# Patient Record
Sex: Female | Born: 1970 | Race: White | Hispanic: No | Marital: Married | Smoking: Never smoker
Health system: Southern US, Community
[De-identification: ages and names within clinical notes are randomized; demographics above are authoritative.]

## PROBLEM LIST (undated history)

## (undated) DIAGNOSIS — N920 Excessive and frequent menstruation with regular cycle: Secondary | ICD-10-CM

## (undated) DIAGNOSIS — R51 Headache: Secondary | ICD-10-CM

## (undated) DIAGNOSIS — Z8719 Personal history of other diseases of the digestive system: Secondary | ICD-10-CM

## (undated) DIAGNOSIS — G43909 Migraine, unspecified, not intractable, without status migrainosus: Secondary | ICD-10-CM

## (undated) DIAGNOSIS — D649 Anemia, unspecified: Secondary | ICD-10-CM

## (undated) DIAGNOSIS — Z973 Presence of spectacles and contact lenses: Secondary | ICD-10-CM

## (undated) HISTORY — PX: COLONOSCOPY: SHX174

## (undated) HISTORY — PX: UPPER GI ENDOSCOPY: SHX6162

## (undated) HISTORY — PX: WISDOM TOOTH EXTRACTION: SHX21

## (undated) HISTORY — PX: WRIST SURGERY: SHX841

---

## 1998-04-18 ENCOUNTER — Other Ambulatory Visit: Admission: RE | Admit: 1998-04-18 | Discharge: 1998-04-18 | Payer: Self-pay | Admitting: Obstetrics and Gynecology

## 1999-04-24 ENCOUNTER — Other Ambulatory Visit: Admission: RE | Admit: 1999-04-24 | Discharge: 1999-04-24 | Payer: Self-pay | Admitting: *Deleted

## 1999-12-03 ENCOUNTER — Encounter: Admission: RE | Admit: 1999-12-03 | Discharge: 1999-12-03 | Payer: Self-pay | Admitting: Family Medicine

## 1999-12-03 ENCOUNTER — Encounter: Payer: Self-pay | Admitting: Family Medicine

## 2000-04-27 ENCOUNTER — Other Ambulatory Visit: Admission: RE | Admit: 2000-04-27 | Discharge: 2000-04-27 | Payer: Self-pay | Admitting: Obstetrics and Gynecology

## 2000-07-20 ENCOUNTER — Encounter (INDEPENDENT_AMBULATORY_CARE_PROVIDER_SITE_OTHER): Payer: Self-pay | Admitting: Specialist

## 2000-07-20 ENCOUNTER — Ambulatory Visit (HOSPITAL_COMMUNITY): Admission: RE | Admit: 2000-07-20 | Discharge: 2000-07-20 | Payer: Self-pay | Admitting: Gastroenterology

## 2001-01-19 ENCOUNTER — Encounter: Admission: RE | Admit: 2001-01-19 | Discharge: 2001-01-19 | Payer: Self-pay | Admitting: Family Medicine

## 2001-01-19 ENCOUNTER — Encounter: Payer: Self-pay | Admitting: Family Medicine

## 2001-05-06 ENCOUNTER — Other Ambulatory Visit: Admission: RE | Admit: 2001-05-06 | Discharge: 2001-05-06 | Payer: Self-pay | Admitting: Obstetrics and Gynecology

## 2001-06-21 ENCOUNTER — Ambulatory Visit (HOSPITAL_COMMUNITY): Admission: RE | Admit: 2001-06-21 | Discharge: 2001-06-21 | Payer: Self-pay | Admitting: Obstetrics and Gynecology

## 2001-06-21 ENCOUNTER — Encounter: Payer: Self-pay | Admitting: Obstetrics and Gynecology

## 2001-07-21 ENCOUNTER — Encounter: Admission: RE | Admit: 2001-07-21 | Discharge: 2001-10-19 | Payer: Self-pay | Admitting: Obstetrics and Gynecology

## 2001-12-30 ENCOUNTER — Inpatient Hospital Stay (HOSPITAL_COMMUNITY): Admission: AD | Admit: 2001-12-30 | Discharge: 2001-12-30 | Payer: Self-pay | Admitting: Obstetrics & Gynecology

## 2002-01-27 ENCOUNTER — Inpatient Hospital Stay (HOSPITAL_COMMUNITY): Admission: AD | Admit: 2002-01-27 | Discharge: 2002-01-29 | Payer: Self-pay | Admitting: Obstetrics & Gynecology

## 2002-03-07 ENCOUNTER — Other Ambulatory Visit: Admission: RE | Admit: 2002-03-07 | Discharge: 2002-03-07 | Payer: Self-pay | Admitting: Obstetrics and Gynecology

## 2003-03-10 ENCOUNTER — Other Ambulatory Visit: Admission: RE | Admit: 2003-03-10 | Discharge: 2003-03-10 | Payer: Self-pay | Admitting: Obstetrics and Gynecology

## 2004-07-20 ENCOUNTER — Emergency Department (HOSPITAL_COMMUNITY): Admission: EM | Admit: 2004-07-20 | Discharge: 2004-07-20 | Payer: Self-pay | Admitting: Emergency Medicine

## 2010-02-14 ENCOUNTER — Encounter: Admission: RE | Admit: 2010-02-14 | Discharge: 2010-02-14 | Payer: Self-pay | Admitting: Cardiology

## 2010-03-18 ENCOUNTER — Encounter: Admission: RE | Admit: 2010-03-18 | Discharge: 2010-03-18 | Payer: Self-pay | Admitting: Obstetrics and Gynecology

## 2010-04-28 HISTORY — PX: WRIST SURGERY: SHX841

## 2010-09-13 NOTE — Op Note (Signed)
[place]  Patient:    Robin Gordon, Robin Gordon                        MRN: [id number] Proc. Date: 07/20/00 Adm. Date:  [id number] Attending:  Nelda Marseille                           Operative Report  PROCEDURE:  Colonoscopy with polypectomy.  ENDOSCOPIST:  Petra Kuba, M.D.  INDICATIONS:  Patient with bright red blood per rectum.  History of colon polyps.  INFORMED CONSENT:  Consent was signed after risk, benefits, methods and options were thoroughly discussed in the office.  MEDICINES USED:  Demerol 100 mg, Versed 10 mg.  PROCEDURE:  Rectal inspection is pertinent for a small external hemorrhoids with a small tear.  Digital exam was negative.  The pediatric video colonoscope was inserted.  She had a tortuous sigmoid which required rolling her on her back and once we rolled her on her back we were easily able to advance to the cecum.  This require some abdominal pressure.  On insertion no obvious abnormalities were seen.  The cecum was identified by the appendiceal orifice and the ileocecal valve.  In fact, the scope was inserted a short ways into the terminal ileum which was normal.  The scope was slowly withdrawn. The prep was adequate.  There was some liquid stool that required washing and suctioning.  Cecum and ascending and majority of the transverse was normal. In the more distal transverse a small 2-3 mm polyp was seen and was hot biopsied x 2.  Scope was further withdrawn.  No other abnormalities were seen as we slowly withdrew back to the rectum.  Once back to the rectum the scope was retroflexed pertinent for some tiny internal hemorrhoids.  Anorectal pull through confirmed the above.  The scope was reinserted.  Air was suctioned. Scope removed.  The patient tolerated the procedure well.  There was no obvious immediate complication.  ENDOSCOPIC DIAGNOSES: 1. Internal external hemorrhoids with a small external tear. 2. A 3 mm distal  transverse polyp hot biopsied x 2. 3. Otherwise within normal limits to the terminal ileum.  PLAN:  Await pathology to determine the future colonic screening.  Go ahead and treat hemorrhoids and the tears both with suppositories and creams.  I would be happy to see back p.r.n. otherwise in 2 months to recheck symptoms, guaiacs and make sure no further work-up plans are needed. DD:  07/20/00 TD:  07/20/00 Job: [id number]

## 2010-09-13 NOTE — Consult Note (Signed)
   NAME:  Robin Gordon, Robin Gordon                           ACCOUNT NO.:  [id number]   MEDICAL RECORD NO.:  [id number]                   PATIENT TYPE:  MAT   LOCATION:  [facility]                                 FACILITY:  [facility]   PHYSICIAN:  Lenoard Aden, M.D.             DATE OF BIRTH:  02-22-1971   DATE OF CONSULTATION:  12/30/2001  DATE OF DISCHARGE:                                   CONSULTATION   CHIEF COMPLAINT:  Rule out preterm labor.   HISTORY OF PRESENT ILLNESS:  The patient is a 40 year old white female, G4,  P1 expected day of delivery 02/14/02 at 33+ weeks with preterm contractions  without evidence of cervical change.   ALLERGIES:  The patient has no known drug allergies.   MEDICATIONS:  Prenatal vitamins.   OBSTETRIC HISTORY:  An 8 pound 9 ounce female born post dates in 25.  Spontaneous abortion in 12/01. Spontaneous abortion 12/02.  History of  irritable bowel syndrome.  History of fibromyalgia. History of colonic polyp  removal.  History of migraine headaches.  History of superficial  varicosities.  History of hyperglycemia.   FAMILY HISTORY:  History of cardiovascular disease and lupus.   PRENATAL LABORATORY DATA:  A blood type of O negative.  Rh antibody  negative.  Rubella immune. Hepatitis B surface antigen negative. HIV  declined.   PRENATAL COURSE:  Prenatal course compromised by preterm contractions.  No  evidence of cervical change.   PHYSICAL EXAMINATION:  GENERAL:  She is a well-developed, well-nourished  white female in no apparent distress. Temperature 98.3.  Pulse 102.  HEENT:  Normal.  LUNGS:  Clear.  HEART:  Regular rate and rhythm.  ABDOMEN:  Soft, gravid, nontender.  CERVICAL EXAM:  Per Dr. Cherly Hensen, closed, 3 cm long and presenting part is at  -3.  EXTREMITIES:  There are no cords.  NEUROLOGICAL:  Nonfocal.   NST:  Reactive. Irregular contractions now abated after subcutaneous  terbutaline x1.   IMPRESSION:  1. A 33-week obstetrics.  2.  Preterm contractions.  No evidence of cervical change.  3. Reassuring fetal heart rates, surveillance.  4. History of term vaginal delivery.    PLAN:  Discharge home. Procardia 10 mg p.o. q.4-6 h. p.r.n.  If recurrence  noted, will proceed with betamethasone treatment.  Follow up in the office  in one week.  Work precautions discussed.  Preterm labor warnings given.  Check clean catch urine UA today.                                                Lenoard Aden, M.D.    RJT/MEDQ  D:  12/30/2001  T:  12/30/2001  Job:  [id number]   cc:   [facility]

## 2010-09-13 NOTE — H&P (Signed)
NAME:  Robin Gordon, Robin Gordon                           ACCOUNT NO.:  [id number]   MEDICAL RECORD NO.:  [id number]                   PATIENT TYPE:  INP   LOCATION:  9143                                 [facility]ACILITY:  [facility]   PHYSICIAN:  Sheronette A. Cherly Hensen, M.D.         DATE OF BIRTH:  [birth year]   DATE OF ADMISSION:  01/27/2002  DATE OF DISCHARGE:                                HISTORY & PHYSICAL   CHIEF COMPLAINT:  Contractions, early labor.   HISTORY OF PRESENT ILLNESS:  This is a 40 year old gravida 4, para 1-0-2-1  married white female, at 37-3/[redacted] weeks gestation by early ultrasound and her  last menstrual period.  She is now being admitted in early labor, with an  examination that was 3, 60%, -2.  The patient has intact membranes.   Her prenatal course has been complicated by preterm labor, for which she was  placed on modified bed rest and given toco lytics.  Group B strep culture is  negative.  Prenatal care is at [facility].   PRIMARY OBSTETRICIAN:  Sheronette A. Cousins, M.D.   LABORATORY DATA:  Blood type 0 negative.  Her husband is A negative.  Antibody screen negative. RPR nonreactive.  Rubella immune.  Hepatitis B  surface antigen negative.  HIV test declined, as was amniocentesis and AFP3  test.  GC and Chlamydia cultures negative.  Pap was normal.  One-hour GCT  was normal.  Group B strep culture is negative.  Anatomic fetal survey done  no September 29, 2001 at 21 weeks was normal.   ALLERGIES:  No known drug allergies.   MEDICATIONS:  Prenatal vitamins.   PAST MEDICAL HISTORY:  1. Hypoglycemia.  2. Irritable bowel syndrome.  3. Fibromyalgia.  4. Migraine headaches.   PAST SURGICAL HISTORY:  Negative.   OBSTETRICAL HISTORY:  An 8 pound 9 ounce female in November 1997.  In December  2001 and 2002, spontaneous abortions.   GYN HISTORY:  The patient had been trying for two years, conceived on  Clomid.   FAMILY HISTORY:  None contributory.   SOCIAL HISTORY:  Married.   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   REVIEW OF SYSTEMS:  Negative, except as noted in the history of present  illness.   PHYSICAL EXAMINATION:  GENERAL:  Gravid white female, slightly  uncomfortable.  VITAL SIGNS:  Temperature 98.1, blood pressure 120/80.  SKIN:  Shows no lesions.  HEENT:  Anicteric sclerae.  Pink conjunctivae.  Oropharynx negative.  HEART:  Regular rate and rhythm with no murmur.  LUNGS:  Clear to auscultation.  BREASTS:  Soft, nontender.  No palpable masses.  ABDOMEN:  Gravid. Fundal height 37 cm.  PELVIC:  As per HPI.   DISPOSITION:  Artificial rupture of membranes was performed.  Copious clear  amniotic fluid was noted.  Cervix advanced to 4 cm, 60%, minus 2 station.  Tracing was reactive with uterine irritability and irregular contractions.  IMPRESSION:  Early labor.  Gestation at 37+ weeks.  Group B strep culture  was negative.   PLAN:  Admission, ambulation.  Pitocin p.r.n.  Epidural p.r.n.                                               Sheronette A. Cherly Hensen, M.D.    SAC/MEDQ  D:  01/27/2002  T:  01/27/2002  Job:  [id number]

## 2010-10-16 ENCOUNTER — Other Ambulatory Visit: Payer: Self-pay | Admitting: Obstetrics and Gynecology

## 2011-03-31 ENCOUNTER — Other Ambulatory Visit: Payer: Self-pay | Admitting: Obstetrics and Gynecology

## 2011-03-31 DIAGNOSIS — Z1231 Encounter for screening mammogram for malignant neoplasm of breast: Secondary | ICD-10-CM

## 2011-04-16 ENCOUNTER — Ambulatory Visit
Admission: RE | Admit: 2011-04-16 | Discharge: 2011-04-16 | Disposition: A | Discharge status: Home / Self Care | Payer: BC Managed Care – PPO | Source: Ambulatory Visit | Attending: Obstetrics and Gynecology | Admitting: Obstetrics and Gynecology

## 2011-04-16 DIAGNOSIS — Z1231 Encounter for screening mammogram for malignant neoplasm of breast: Secondary | ICD-10-CM

## 2011-04-28 ENCOUNTER — Other Ambulatory Visit: Payer: Self-pay | Admitting: Obstetrics and Gynecology

## 2011-04-28 DIAGNOSIS — R928 Other abnormal and inconclusive findings on diagnostic imaging of breast: Secondary | ICD-10-CM

## 2011-05-08 ENCOUNTER — Ambulatory Visit
Admission: RE | Admit: 2011-05-08 | Discharge: 2011-05-08 | Disposition: A | Discharge status: Home / Self Care | Payer: BC Managed Care – PPO | Source: Ambulatory Visit | Attending: Obstetrics and Gynecology | Admitting: Obstetrics and Gynecology

## 2011-05-08 DIAGNOSIS — R928 Other abnormal and inconclusive findings on diagnostic imaging of breast: Secondary | ICD-10-CM

## 2012-03-19 ENCOUNTER — Other Ambulatory Visit: Payer: Self-pay | Admitting: Obstetrics and Gynecology

## 2012-03-19 DIAGNOSIS — Z1231 Encounter for screening mammogram for malignant neoplasm of breast: Secondary | ICD-10-CM

## 2012-05-05 ENCOUNTER — Ambulatory Visit
Admission: RE | Admit: 2012-05-05 | Discharge: 2012-05-05 | Disposition: A | Discharge status: Home / Self Care | Payer: BC Managed Care – PPO | Source: Ambulatory Visit | Attending: Obstetrics and Gynecology | Admitting: Obstetrics and Gynecology

## 2012-05-05 DIAGNOSIS — Z1231 Encounter for screening mammogram for malignant neoplasm of breast: Secondary | ICD-10-CM

## 2013-05-10 ENCOUNTER — Ambulatory Visit
Admission: RE | Admit: 2013-05-10 | Discharge: 2013-05-10 | Disposition: A | Discharge status: Home / Self Care | Payer: BC Managed Care – PPO | Source: Ambulatory Visit | Attending: Family Medicine | Admitting: Family Medicine

## 2013-05-10 ENCOUNTER — Other Ambulatory Visit: Payer: Self-pay | Admitting: Family Medicine

## 2013-05-10 DIAGNOSIS — R059 Cough, unspecified: Secondary | ICD-10-CM

## 2013-05-10 DIAGNOSIS — R05 Cough: Secondary | ICD-10-CM

## 2013-05-24 ENCOUNTER — Other Ambulatory Visit: Payer: Self-pay

## 2013-05-24 DIAGNOSIS — Z1231 Encounter for screening mammogram for malignant neoplasm of breast: Secondary | ICD-10-CM

## 2013-06-08 ENCOUNTER — Ambulatory Visit
Admission: RE | Admit: 2013-06-08 | Discharge: 2013-06-08 | Disposition: A | Discharge status: Home / Self Care | Payer: BC Managed Care – PPO | Source: Ambulatory Visit

## 2013-06-08 DIAGNOSIS — Z1231 Encounter for screening mammogram for malignant neoplasm of breast: Secondary | ICD-10-CM

## 2014-06-22 ENCOUNTER — Other Ambulatory Visit: Payer: Self-pay

## 2014-06-22 DIAGNOSIS — Z1231 Encounter for screening mammogram for malignant neoplasm of breast: Secondary | ICD-10-CM

## 2014-07-03 ENCOUNTER — Ambulatory Visit: Payer: BC Managed Care – PPO

## 2014-07-12 ENCOUNTER — Ambulatory Visit
Admission: RE | Admit: 2014-07-12 | Discharge: 2014-07-12 | Disposition: A | Discharge status: Home / Self Care | Payer: BC Managed Care – PPO | Source: Ambulatory Visit

## 2014-07-12 DIAGNOSIS — Z1231 Encounter for screening mammogram for malignant neoplasm of breast: Secondary | ICD-10-CM

## 2015-04-29 HISTORY — PX: UPPER GI ENDOSCOPY: SHX6162

## 2015-08-16 ENCOUNTER — Other Ambulatory Visit: Payer: Self-pay

## 2015-08-16 DIAGNOSIS — Z1231 Encounter for screening mammogram for malignant neoplasm of breast: Secondary | ICD-10-CM

## 2015-09-03 ENCOUNTER — Ambulatory Visit
Admission: RE | Admit: 2015-09-03 | Discharge: 2015-09-03 | Disposition: A | Discharge status: Home / Self Care | Payer: BC Managed Care – PPO | Source: Ambulatory Visit

## 2015-09-03 DIAGNOSIS — Z1231 Encounter for screening mammogram for malignant neoplasm of breast: Secondary | ICD-10-CM

## 2015-09-19 ENCOUNTER — Other Ambulatory Visit: Payer: Self-pay | Admitting: Family Medicine

## 2015-09-19 DIAGNOSIS — R1011 Right upper quadrant pain: Secondary | ICD-10-CM

## 2015-09-20 ENCOUNTER — Ambulatory Visit
Admission: RE | Admit: 2015-09-20 | Discharge: 2015-09-20 | Disposition: A | Discharge status: Home / Self Care | Payer: BC Managed Care – PPO | Source: Ambulatory Visit | Attending: Family Medicine | Admitting: Family Medicine

## 2015-09-20 DIAGNOSIS — R1011 Right upper quadrant pain: Secondary | ICD-10-CM

## 2015-09-26 ENCOUNTER — Other Ambulatory Visit: Payer: BC Managed Care – PPO

## 2015-11-19 ENCOUNTER — Other Ambulatory Visit: Payer: Self-pay | Admitting: Gastroenterology

## 2015-11-19 DIAGNOSIS — R1011 Right upper quadrant pain: Secondary | ICD-10-CM

## 2015-11-19 DIAGNOSIS — R634 Abnormal weight loss: Secondary | ICD-10-CM

## 2015-11-21 ENCOUNTER — Other Ambulatory Visit: Payer: BC Managed Care – PPO

## 2015-11-21 ENCOUNTER — Ambulatory Visit
Admission: RE | Admit: 2015-11-21 | Discharge: 2015-11-21 | Disposition: A | Discharge status: Home / Self Care | Payer: BC Managed Care – PPO | Source: Ambulatory Visit | Attending: Gastroenterology | Admitting: Gastroenterology

## 2015-11-21 DIAGNOSIS — R634 Abnormal weight loss: Secondary | ICD-10-CM

## 2015-11-21 DIAGNOSIS — R1011 Right upper quadrant pain: Secondary | ICD-10-CM

## 2015-11-21 MED ORDER — IOPAMIDOL (ISOVUE-300) INJECTION 61%
100.0000 mL | Freq: Once | INTRAVENOUS | Status: AC | PRN
  Administered 2015-11-21: 100 mL via INTRAVENOUS

## 2015-11-22 ENCOUNTER — Telehealth: Payer: Self-pay | Admitting: *Deleted

## 2015-11-22 NOTE — Telephone Encounter (Signed)
-----   Message from Irven Baltimore sent at 11/21/2015  4:28 PM EDT ----- Patient states that she was just talking to you and the phone call dropped. Best # [phone]

## 2015-11-22 NOTE — Telephone Encounter (Signed)
I called the patient back and admitted I didn't recall, calling her.  She said she was trying to reach the nurse for Dr. Watt Climes. I advised her he is not at this office.  She apologized for calling our number.

## 2015-11-23 ENCOUNTER — Other Ambulatory Visit: Payer: BC Managed Care – PPO

## 2015-12-05 ENCOUNTER — Other Ambulatory Visit: Payer: Self-pay | Admitting: Gastroenterology

## 2016-08-26 ENCOUNTER — Other Ambulatory Visit: Payer: Self-pay | Admitting: Family Medicine

## 2016-08-26 DIAGNOSIS — Z1231 Encounter for screening mammogram for malignant neoplasm of breast: Secondary | ICD-10-CM

## 2016-09-17 ENCOUNTER — Ambulatory Visit
Admission: RE | Admit: 2016-09-17 | Discharge: 2016-09-17 | Disposition: A | Discharge status: Home / Self Care | Payer: BC Managed Care – PPO | Source: Ambulatory Visit | Attending: Family Medicine | Admitting: Family Medicine

## 2016-09-17 DIAGNOSIS — Z1231 Encounter for screening mammogram for malignant neoplasm of breast: Secondary | ICD-10-CM

## 2017-02-18 IMAGING — US US ABDOMEN LIMITED
1 series · 14 of 25 positions shown · non-contrast
Comparison: No recent prior.

CLINICAL DATA: Right upper quadrant pain.

EXAM:
US ABDOMEN LIMITED - RIGHT UPPER QUADRANT

[Series 1: us abdomen limited · 0.32mm/px · 14 of 36 slices shown]
[im 1/36]
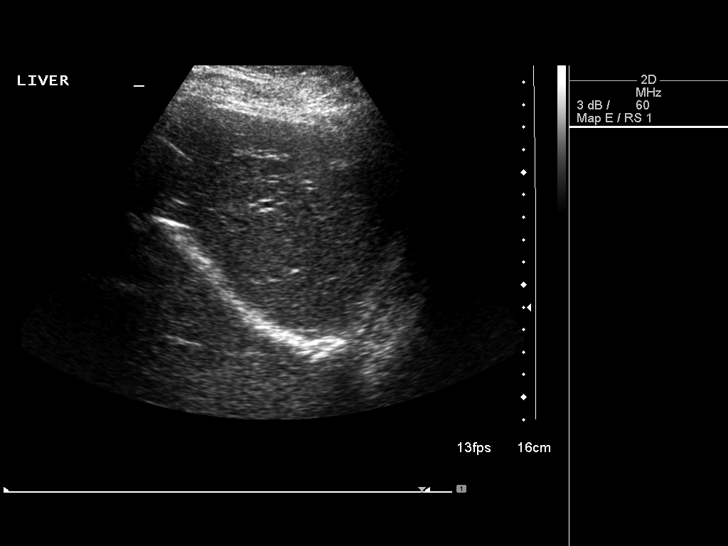
[im 3/36]
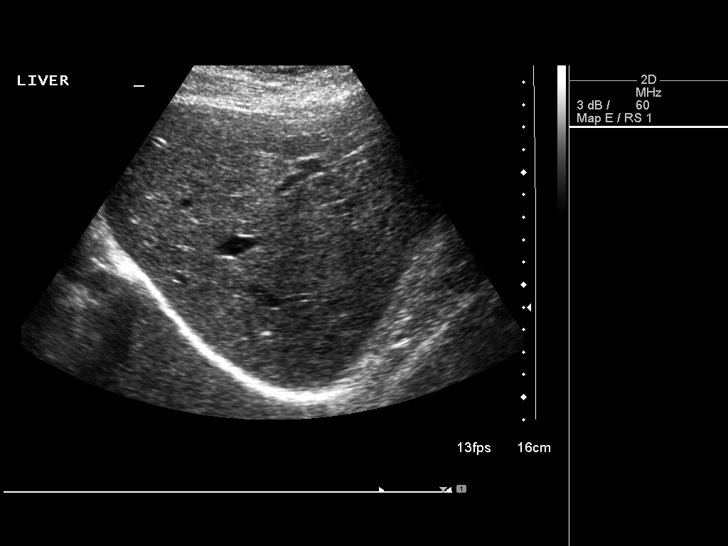
[im 6/36]
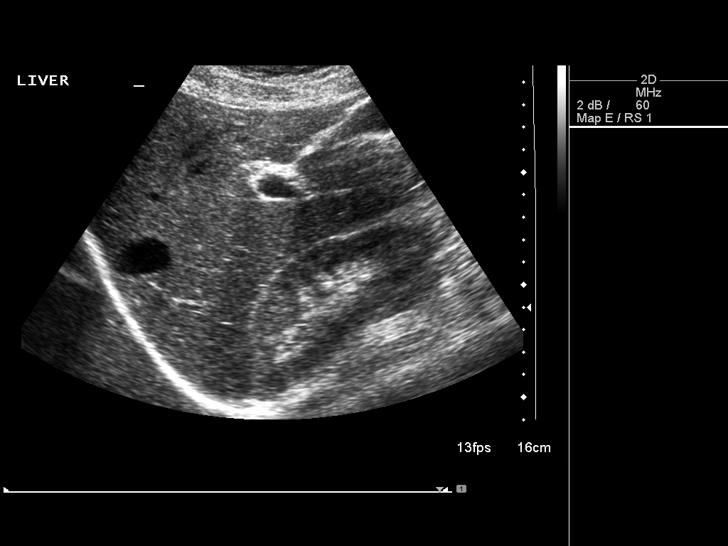
[im 9/36]
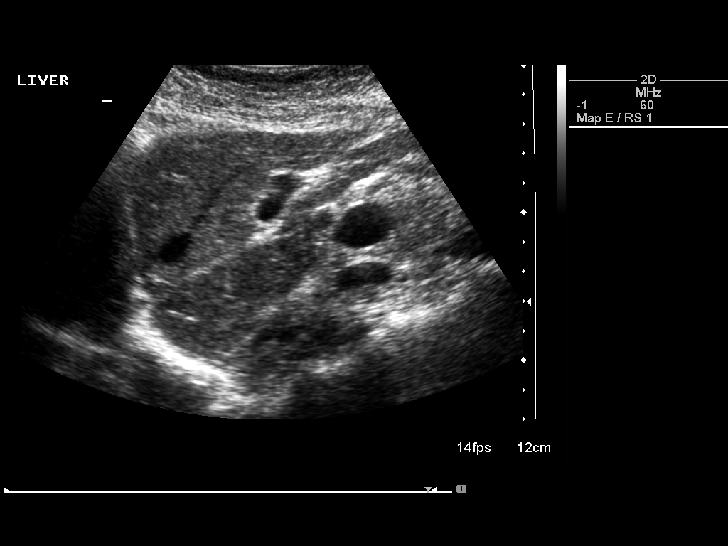
[im 12/36]
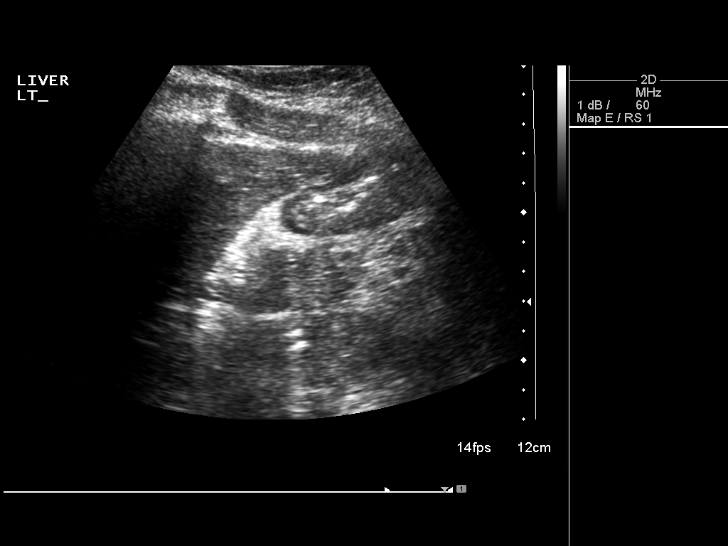
[im 14/36]
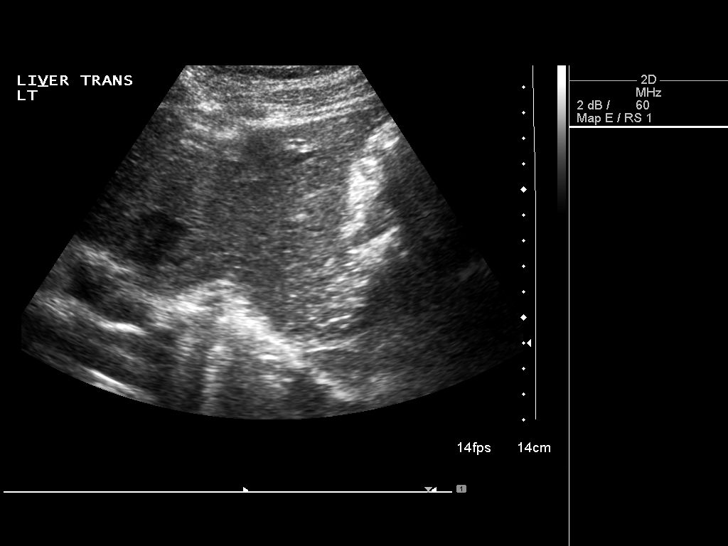
[im 17/36]
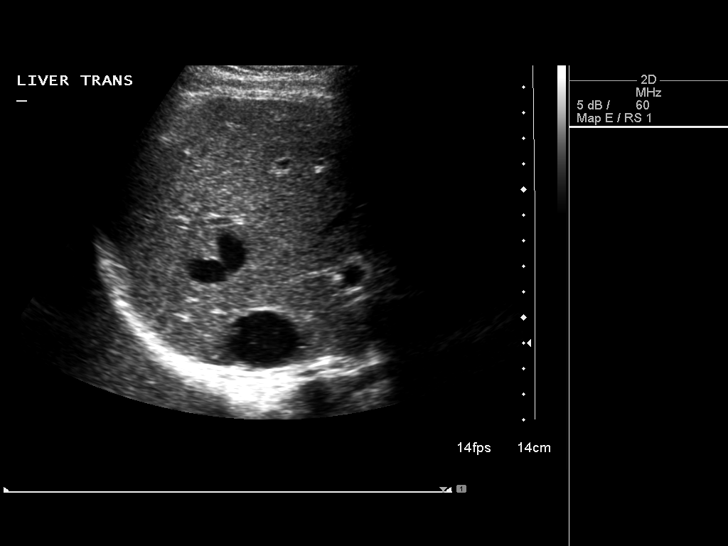
[im 19/36]
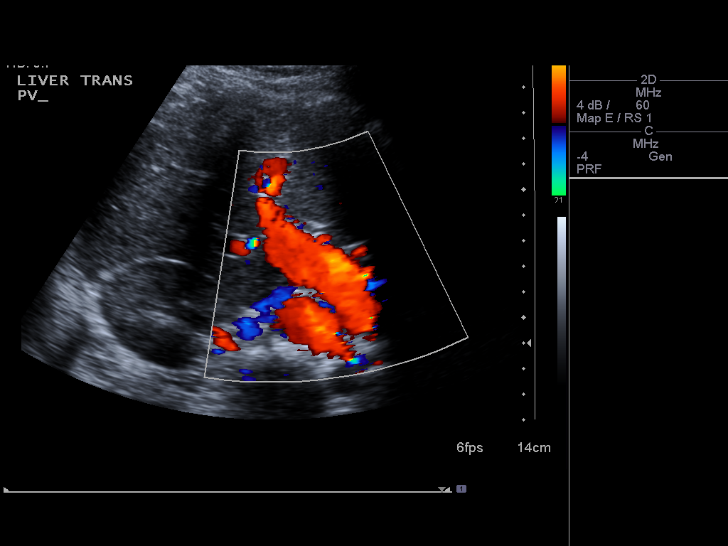
[im 22/36]
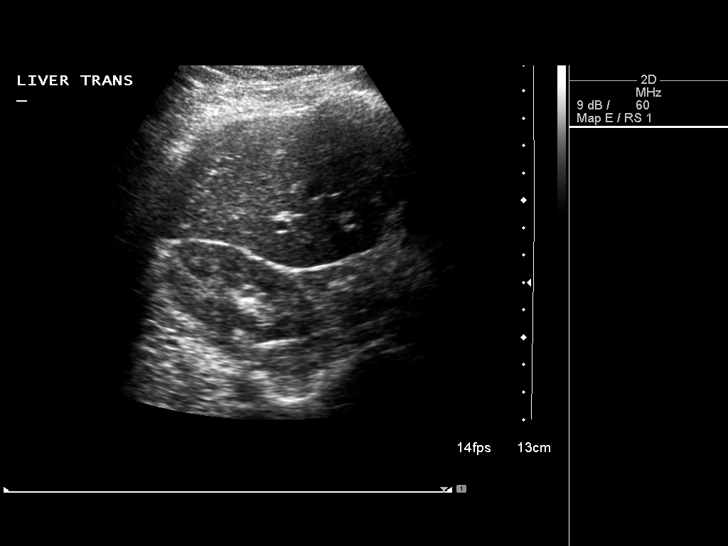
[im 24/36]
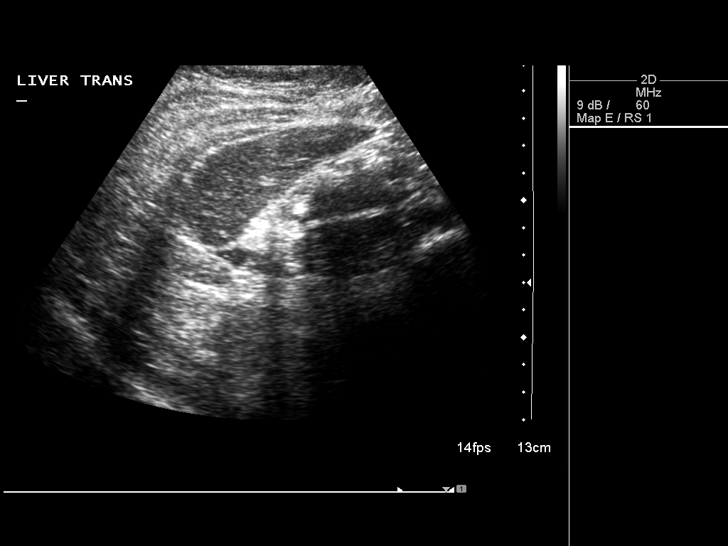
[im 27/36]
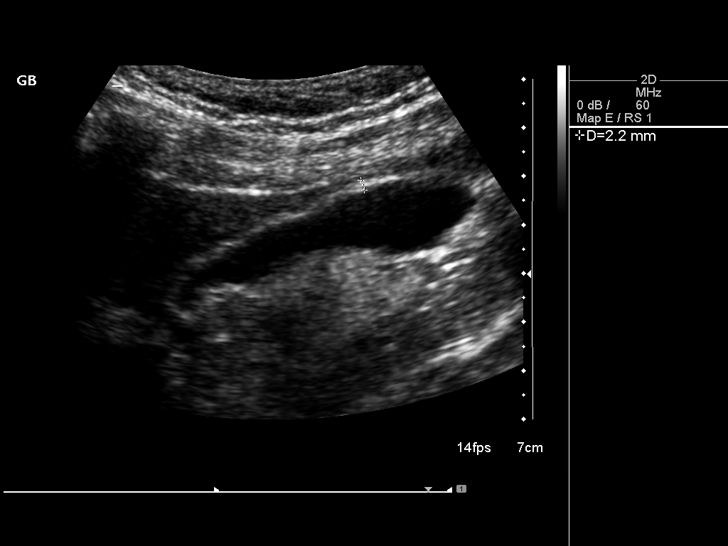
[im 30/36]
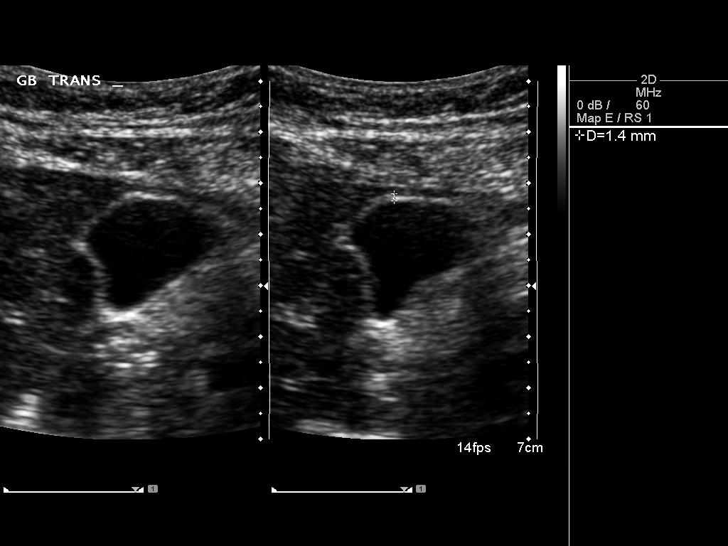
[im 33/36]
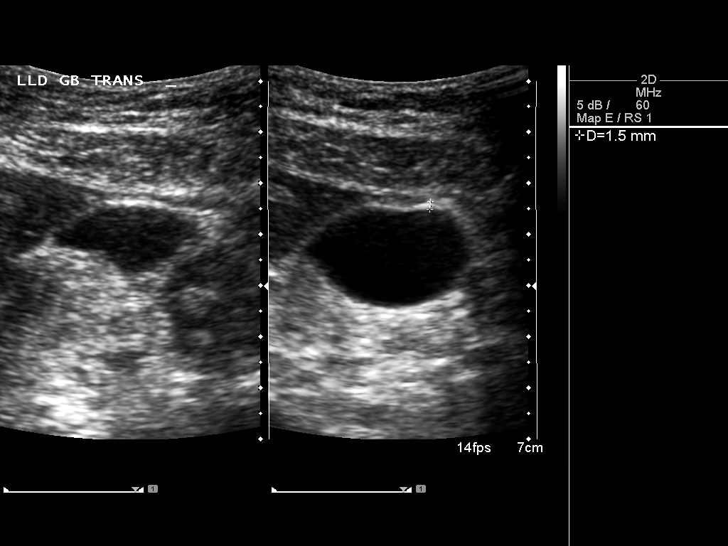
[im 36/36]
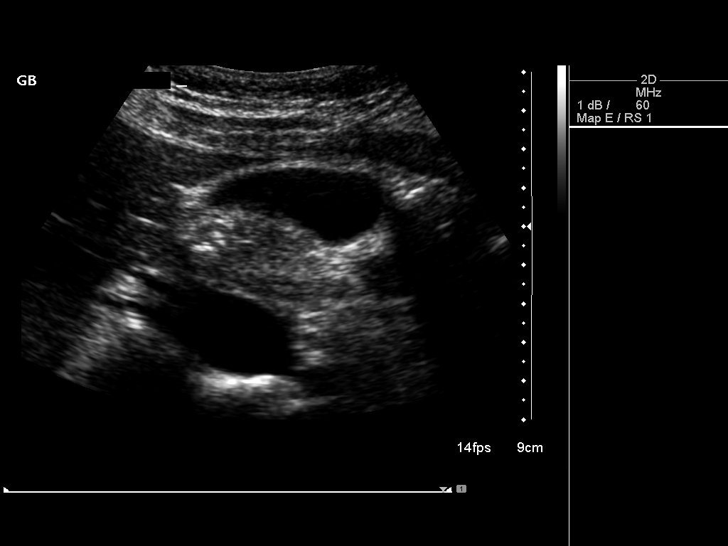

[14 of 25 positions shown; findings below may reference images not displayed]

FINDINGS: Gallbladder:

No gallstones or wall thickening visualized. No sonographic Murphy
sign noted by sonographer.

Common bile duct:

Diameter: 4.3 mm

Liver:

No focal lesion identified. Within normal limits in parenchymal
echogenicity.
IMPRESSION: Negative exam.

## 2017-06-09 ENCOUNTER — Other Ambulatory Visit: Payer: Self-pay | Admitting: Obstetrics and Gynecology

## 2017-06-10 ENCOUNTER — Other Ambulatory Visit: Payer: Self-pay

## 2017-06-10 ENCOUNTER — Encounter (HOSPITAL_COMMUNITY): Payer: Self-pay | Admitting: *Deleted

## 2017-06-18 ENCOUNTER — Ambulatory Visit (HOSPITAL_COMMUNITY)
Admission: AD | Admit: 2017-06-18 | Discharge: 2017-06-18 | Disposition: A | Discharge status: Home / Self Care | Payer: BC Managed Care – PPO | Source: Ambulatory Visit | Attending: Obstetrics and Gynecology | Admitting: Obstetrics and Gynecology

## 2017-06-18 ENCOUNTER — Encounter (HOSPITAL_COMMUNITY)
Admission: AD | Disposition: A | Discharge status: Home / Self Care | Payer: Self-pay | Source: Ambulatory Visit | Attending: Obstetrics and Gynecology

## 2017-06-18 ENCOUNTER — Ambulatory Visit (HOSPITAL_COMMUNITY): Payer: BC Managed Care – PPO | Admitting: Anesthesiology

## 2017-06-18 ENCOUNTER — Encounter (HOSPITAL_COMMUNITY): Payer: Self-pay

## 2017-06-18 ENCOUNTER — Other Ambulatory Visit: Payer: Self-pay

## 2017-06-18 DIAGNOSIS — N921 Excessive and frequent menstruation with irregular cycle: Secondary | ICD-10-CM | POA: Diagnosis present

## 2017-06-18 DIAGNOSIS — D259 Leiomyoma of uterus, unspecified: Secondary | ICD-10-CM | POA: Diagnosis present

## 2017-06-18 DIAGNOSIS — D250 Submucous leiomyoma of uterus: Secondary | ICD-10-CM | POA: Insufficient documentation

## 2017-06-18 HISTORY — DX: Headache: R51

## 2017-06-18 HISTORY — DX: Personal history of other diseases of the digestive system: Z87.19

## 2017-06-18 HISTORY — PX: DILATATION & CURETTAGE/HYSTEROSCOPY WITH MYOSURE: SHX6511

## 2017-06-18 LAB — CBC
HEMATOCRIT: 40.1 % (ref 36.0–46.0)
Hemoglobin: 13.4 g/dL (ref 12.0–15.0)
MCH: 29.6 pg (ref 26.0–34.0)
MCHC: 33.4 g/dL (ref 30.0–36.0)
MCV: 88.5 fL (ref 78.0–100.0)
Platelets: 276 10*3/uL (ref 150–400)
RBC: 4.53 MIL/uL (ref 3.87–5.11)
RDW: 12.8 % (ref 11.5–15.5)
WBC: 8.9 10*3/uL (ref 4.0–10.5)

## 2017-06-18 LAB — PREGNANCY, URINE: Preg Test, Ur: NEGATIVE

## 2017-06-18 SURGERY — DILATATION & CURETTAGE/HYSTEROSCOPY WITH MYOSURE
Anesthesia: General | Site: Vagina

## 2017-06-18 MED ORDER — ONDANSETRON HCL 4 MG/2ML IJ SOLN
INTRAMUSCULAR | Status: AC
  Filled 2017-06-18: qty 2

## 2017-06-18 MED ORDER — LIDOCAINE HCL (CARDIAC) 20 MG/ML IV SOLN
INTRAVENOUS | Status: AC
  Filled 2017-06-18: qty 5

## 2017-06-18 MED ORDER — PROPOFOL 10 MG/ML IV BOLUS
INTRAVENOUS | Status: AC
  Filled 2017-06-18: qty 20

## 2017-06-18 MED ORDER — KETOROLAC TROMETHAMINE 30 MG/ML IJ SOLN
INTRAMUSCULAR | Status: AC
  Filled 2017-06-18: qty 1

## 2017-06-18 MED ORDER — KETOROLAC TROMETHAMINE 30 MG/ML IJ SOLN
INTRAMUSCULAR | Status: DC | PRN
  Administered 2017-06-18: 30 mg via INTRAVENOUS

## 2017-06-18 MED ORDER — FENTANYL CITRATE (PF) 100 MCG/2ML IJ SOLN
INTRAMUSCULAR | Status: AC
  Filled 2017-06-18: qty 2

## 2017-06-18 MED ORDER — SCOPOLAMINE 1 MG/3DAYS TD PT72
1.0000 | MEDICATED_PATCH | Freq: Once | TRANSDERMAL | Status: DC
  Administered 2017-06-18: 1.5 mg via TRANSDERMAL

## 2017-06-18 MED ORDER — SCOPOLAMINE 1 MG/3DAYS TD PT72
MEDICATED_PATCH | TRANSDERMAL | Status: AC
  Administered 2017-06-18: 1.5 mg via TRANSDERMAL
  Filled 2017-06-18: qty 1

## 2017-06-18 MED ORDER — HYDROCODONE-ACETAMINOPHEN 5-325 MG PO TABS: 1.0000 | ORAL_TABLET | Freq: Four times a day (QID) | ORAL | 0 refills | Status: DC | PRN

## 2017-06-18 MED ORDER — ONDANSETRON HCL 4 MG/2ML IJ SOLN
INTRAMUSCULAR | Status: DC | PRN
  Administered 2017-06-18: 4 mg via INTRAVENOUS

## 2017-06-18 MED ORDER — PROPOFOL 10 MG/ML IV BOLUS
INTRAVENOUS | Status: DC | PRN
  Administered 2017-06-18: 150 mg via INTRAVENOUS

## 2017-06-18 MED ORDER — FENTANYL CITRATE (PF) 100 MCG/2ML IJ SOLN
INTRAMUSCULAR | Status: DC | PRN
  Administered 2017-06-18 (×2): 50 ug via INTRAVENOUS

## 2017-06-18 MED ORDER — MIDAZOLAM HCL 5 MG/5ML IJ SOLN
INTRAMUSCULAR | Status: DC | PRN
  Administered 2017-06-18: 2 mg via INTRAVENOUS

## 2017-06-18 MED ORDER — MEPERIDINE HCL 25 MG/ML IJ SOLN
6.2500 mg | INTRAMUSCULAR | Status: DC | PRN
  Administered 2017-06-18: 6.25 mg via INTRAVENOUS

## 2017-06-18 MED ORDER — MEPERIDINE HCL 25 MG/ML IJ SOLN
INTRAMUSCULAR | Status: AC
  Administered 2017-06-18: 6.25 mg via INTRAVENOUS
  Filled 2017-06-18: qty 1

## 2017-06-18 MED ORDER — MIDAZOLAM HCL 2 MG/2ML IJ SOLN
INTRAMUSCULAR | Status: AC
  Filled 2017-06-18: qty 2

## 2017-06-18 MED ORDER — ACETAMINOPHEN 10 MG/ML IV SOLN: 1000.0000 mg | Freq: Once | INTRAVENOUS | Status: DC | PRN

## 2017-06-18 MED ORDER — DEXAMETHASONE SODIUM PHOSPHATE 10 MG/ML IJ SOLN
INTRAMUSCULAR | Status: AC
  Filled 2017-06-18: qty 1

## 2017-06-18 MED ORDER — DEXAMETHASONE SODIUM PHOSPHATE 10 MG/ML IJ SOLN
INTRAMUSCULAR | Status: DC | PRN
  Administered 2017-06-18: 10 mg via INTRAVENOUS

## 2017-06-18 MED ORDER — HYDROCODONE-ACETAMINOPHEN 7.5-325 MG PO TABS: 1.0000 | ORAL_TABLET | Freq: Once | ORAL | Status: DC | PRN

## 2017-06-18 MED ORDER — LIDOCAINE HCL (CARDIAC) 20 MG/ML IV SOLN
INTRAVENOUS | Status: DC | PRN
  Administered 2017-06-18: 40 mg via INTRAVENOUS

## 2017-06-18 MED ORDER — HYDROMORPHONE HCL 1 MG/ML IJ SOLN: 0.2500 mg | INTRAMUSCULAR | Status: DC | PRN

## 2017-06-18 MED ORDER — SODIUM CHLORIDE 0.9 % IR SOLN
Status: DC | PRN
  Administered 2017-06-18 (×3): 3000 mL

## 2017-06-18 MED ORDER — PROMETHAZINE HCL 25 MG/ML IJ SOLN: 6.2500 mg | INTRAMUSCULAR | Status: DC | PRN

## 2017-06-18 MED ORDER — LACTATED RINGERS IV SOLN
INTRAVENOUS | Status: DC
  Administered 2017-06-18: 125 mL/h via INTRAVENOUS
  Administered 2017-06-18: 12:00:00 via INTRAVENOUS

## 2017-06-18 MED ORDER — IBUPROFEN 200 MG PO TABS: 800.0000 mg | ORAL_TABLET | Freq: Three times a day (TID) | ORAL | 4 refills | Status: DC | PRN

## 2017-06-18 SURGICAL SUPPLY — 17 items
CANISTER SUCT 3000ML PPV (MISCELLANEOUS) ×4 IMPLANT
CATH ROBINSON RED A/P 16FR (CATHETERS) ×2 IMPLANT
DEVICE MYOSURE LITE (MISCELLANEOUS) IMPLANT
DEVICE MYOSURE REACH (MISCELLANEOUS) IMPLANT
FILTER ARTHROSCOPY CONVERTOR (FILTER) ×2 IMPLANT
GLOVE BIOGEL PI IND STRL 7.0 (GLOVE) ×2 IMPLANT
GLOVE BIOGEL PI INDICATOR 7.0 (GLOVE) ×2
GLOVE ECLIPSE 6.5 STRL STRAW (GLOVE) ×2 IMPLANT
GOWN STRL REUS W/TWL LRG LVL3 (GOWN DISPOSABLE) ×4 IMPLANT
MYOSURE XL FIBROID REM (MISCELLANEOUS) ×2
PACK VAGINAL MINOR WOMEN LF (CUSTOM PROCEDURE TRAY) ×2 IMPLANT
PAD OB MATERNITY 4.3X12.25 (PERSONAL CARE ITEMS) ×2 IMPLANT
SEAL ROD LENS SCOPE MYOSURE (ABLATOR) ×2 IMPLANT
SYSTEM TISS REMOVAL MYSR XL RM (MISCELLANEOUS) IMPLANT
TOWEL OR 17X24 6PK STRL BLUE (TOWEL DISPOSABLE) ×4 IMPLANT
TUBING AQUILEX INFLOW (TUBING) ×2 IMPLANT
TUBING AQUILEX OUTFLOW (TUBING) ×2 IMPLANT

## 2017-06-18 NOTE — Anesthesia Procedure Notes (Signed)
Procedure Name: LMA Insertion Date/Time: 06/18/2017 11:52 AM Performed by: Ignacia Bayley, CRNA Pre-anesthesia Checklist: Patient identified, Emergency Drugs available, Suction available and Patient being monitored Patient Re-evaluated:Patient Re-evaluated prior to induction Oxygen Delivery Method: Circle system utilized Preoxygenation: Pre-oxygenation with 100% oxygen Induction Type: IV induction Ventilation: Mask ventilation without difficulty LMA: LMA inserted LMA Size: 4.0 Number of attempts: 1 Placement Confirmation: positive ETCO2 and breath sounds checked- equal and bilateral Dental Injury: Teeth and Oropharynx as per pre-operative assessment

## 2017-06-18 NOTE — Anesthesia Postprocedure Evaluation (Signed)
Anesthesia Post Note  Patient: Shireen Rayburn  Procedure(s) Performed: DILATATION & CURETTAGE/HYSTEROSCOPY WITH MYOSURE XL (N/A Vagina )     Patient location during evaluation: PACU Anesthesia Type: General Level of consciousness: awake and alert Pain management: pain level controlled Vital Signs Assessment: post-procedure vital signs reviewed and stable Respiratory status: spontaneous breathing, nonlabored ventilation, respiratory function stable and patient connected to nasal cannula oxygen Cardiovascular status: blood pressure returned to baseline and stable Postop Assessment: no apparent nausea or vomiting Anesthetic complications: no    Last Vitals:  Vitals:   06/18/17 1345 06/18/17 1430  BP: 121/85 111/89  Pulse: 91 93  Resp: 15 16  Temp:  36.7 C  SpO2: 100% 98%    Last Pain:  Vitals:   06/18/17 1030  TempSrc: Oral   Pain Goal: Patients Stated Pain Goal: 5 (06/18/17 1030)               Barnet Glasgow

## 2017-06-18 NOTE — Brief Op Note (Signed)
06/18/2017  12:55 PM  PATIENT:  Robin Gordon  47 y.o. female  PRE-OPERATIVE DIAGNOSIS:  Submucosal Fibroid, Menometrorrhagia  POST-OPERATIVE DIAGNOSIS:  Submucosal Fibroid, Menometrorrhagia  PROCEDURE:  Diagnostic hysteroscopy, hysteroscopic resection of submucosal fibroid, D&C  SURGEON:  Surgeon(s) and Role:    * Servando Salina, MD - Primary  PHYSICIAN ASSISTANT:   ASSISTANTS: none   ANESTHESIA:   general FINDINGS; post larger SM fibroid, ant pea size fibroid. Tubal ostias seen EBL:  5 mL   BLOOD ADMINISTERED:none  DRAINS: none   LOCAL MEDICATIONS USED:  NONE  SPECIMEN:  Source of Specimen:  emc with submucosal fibroid resection  DISPOSITION OF SPECIMEN:  PATHOLOGY  COUNTS:  YES  TOURNIQUET:  * No tourniquets in log *  DICTATION: .Other Dictation: Dictation Number [id number]  PLAN OF CARE: Discharge to home after PACU  PATIENT DISPOSITION:  PACU - hemodynamically stable.   Delay start of Pharmacological VTE agent (>24hrs) due to surgical blood loss or risk of bleeding: no

## 2017-06-18 NOTE — Anesthesia Preprocedure Evaluation (Addendum)
Anesthesia Evaluation  Patient identified by MRN, date of birth, ID band Patient awake    Reviewed: Allergy & Precautions, NPO status , Patient's Chart, lab work & pertinent test results  Airway Mallampati: II  TM Distance: >3 FB Neck ROM: Full    Dental no notable dental hx.    Pulmonary neg pulmonary ROS,    Pulmonary exam normal breath sounds clear to auscultation       Cardiovascular negative cardio ROS Normal cardiovascular exam Rhythm:Regular Rate:Normal     Neuro/Psych negative neurological ROS     GI/Hepatic Neg liver ROS,   Endo/Other    Renal/GU negative Renal ROS     Musculoskeletal   Abdominal   Peds  Hematology   Anesthesia Other Findings   Reproductive/Obstetrics                            Anesthesia Physical Anesthesia Plan  ASA: I  Anesthesia Plan: General   Post-op Pain Management:    Induction: Intravenous  PONV Risk Score and Plan: Treatment may vary due to age or medical condition, Dexamethasone, Ondansetron and Scopolamine patch - Pre-op  Airway Management Planned: LMA  Additional Equipment:   Intra-op Plan:   Post-operative Plan:   Informed Consent: I have reviewed the patients History and Physical, chart, labs and discussed the procedure including the risks, benefits and alternatives for the proposed anesthesia with the patient or authorized representative who has indicated his/her understanding and acceptance.   Dental advisory given  Plan Discussed with: Anesthesiologist and CRNA  Anesthesia Plan Comments:         Anesthesia Quick Evaluation

## 2017-06-18 NOTE — Transfer of Care (Signed)
Immediate Anesthesia Transfer of Care Note  Patient: Robin Gordon  Procedure(s) Performed: DILATATION & CURETTAGE/HYSTEROSCOPY WITH MYOSURE XL (N/A Vagina )  Patient Location: PACU  Anesthesia Type:General  Level of Consciousness: sedated  Airway & Oxygen Therapy: Patient Spontanous Breathing and Patient connected to nasal cannula oxygen  Post-op Assessment: Report given to RN and Post -op Vital signs reviewed and stable  Post vital signs: stable  Last Vitals:  Vitals:   06/18/17 1030  BP: 103/90  Pulse: 83  Resp: 16  Temp: 36.9 C  SpO2: 100%    Last Pain:  Vitals:   06/18/17 1030  TempSrc: Oral      Patients Stated Pain Goal: 5 (00/51/10 2111)  Complications: No apparent anesthesia complications

## 2017-06-18 NOTE — H&P (Signed)
Robin Gordon is an 47 y.o. female G16P2 F presents for surgical mgmt of SM fibroid ? endom polyp noted on sonohysterogram done For c/o excessive menstrual cycles  Pertinent Gynecological History: Menses: flow is excessive with use of 5 pads or tampons on heaviest days Bleeding: menorrhagia Contraception: OCP (estrogen/progesterone) DES exposure: denies Blood transfusions: none Sexually transmitted diseases: no past history Previous GYN Procedures: DNC  Last mammogram: normal Date: 2018 Last pap: normal Date: 2018 OB History: G4P2   Menstrual History: Menarche age: n/a No LMP recorded (lmp unknown). Patient is not currently having periods (Reason: Oral contraceptives).    Past Medical History:  Diagnosis Date  . Headache   . History of hiatal hernia   . SVD (spontaneous vaginal delivery)    x 2    Past Surgical History:  Procedure Laterality Date  . COLONOSCOPY     several  . UPPER GI ENDOSCOPY     hiatal hernia  . WISDOM TOOTH EXTRACTION    . WRIST SURGERY Left     Family History  Problem Relation Age of Onset  . Breast cancer Neg Hx     Social History:  reports that  has never smoked. she has never used smokeless tobacco. She reports that she drinks about 1.2 oz of alcohol per week. She reports that she does not use drugs.  Allergies: No Known Allergies  No medications prior to admission.    Review of Systems  All other systems reviewed and are negative.   Height 5\' 5"  (1.651 m), weight 62.1 kg (137 lb). Physical Exam  Constitutional: She is oriented to person, place, and time. She appears well-developed and well-nourished.  HENT:  Head: Atraumatic.  Eyes: EOM are normal.  Neck: Neck supple.  Cardiovascular: Regular rhythm.  Respiratory: Breath sounds normal.  GI: Bowel sounds are normal.  Genitourinary: Vagina normal.  Neurological: She is alert and oriented to person, place, and time.  Skin: Skin is warm and dry.    No results found for this or any  previous visit (from the past 24 hour(s)).  No results found.  Assessment/Plan: Endometrial mass Menorrhagia with regular cycles P) dx hysteroscopy, hysteroscopic resection of endometrial mass using myosure XL, D&C. Risk of surgery reviewed Including infection, bleeding, injury to surrounding organ structures, fluid overload and its mgmt, thermal injury, uterine perforation And its risk, inability to resect mass completely. All ? answered  Abri Vacca A Almus Woodham 06/18/2017, 3:47 AM

## 2017-06-18 NOTE — Discharge Instructions (Signed)
°  DISCHARGE INSTRUCTIONS: D&C / D&E The following instructions have been prepared to help you care for yourself upon your return home.   Personal hygiene:  Use sanitary pads for vaginal drainage, not tampons.  Shower the day after your procedure.  NO tub baths, pools or Jacuzzis for 2-3 weeks.  Wipe front to back after using the bathroom.  Activity and limitations:  Do NOT drive or operate any equipment for 24 hours. The effects of anesthesia are still present and drowsiness may result.  Do NOT rest in bed all day.  Walking is encouraged.  Walk up and down stairs slowly.  You may resume your normal activity in one to two days or as indicated by your physician.  Sexual activity: NO intercourse for at least 2 weeks after the procedure, or as indicated by your physician.  Diet: Eat a light meal as desired this evening. You may resume your usual diet tomorrow.  Return to work: You may resume your work activities in one to two days or as indicated by your doctor.  What to expect after your surgery: Expect to have vaginal bleeding/discharge for 2-3 days and spotting for up to 10 days. It is not unusual to have soreness for up to 1-2 weeks. You may have a slight burning sensation when you urinate for the first day. Mild cramps may continue for a couple of days. You may have a regular period in 2-6 weeks.  Call your doctor for any of the following:  Excessive vaginal bleeding, saturating and changing one pad every hour.  Inability to urinate 6 hours after discharge from hospital.  Pain not relieved by pain medication.  Fever of 100.4 F or greater.  Unusual vaginal discharge or odor.   Post Anesthesia Home Care Instructions  Activity: Get plenty of rest for the remainder of the day. A responsible individual must stay with you for 24 hours following the procedure.  For the next 24 hours, DO NOT: -Drive a car -Paediatric nurse -Drink alcoholic beverages -Take any  medication unless instructed by your physician -Make any legal decisions or sign important papers.  Meals: Start with liquid foods such as gelatin or soup. Progress to regular foods as tolerated. Avoid greasy, spicy, heavy foods. If nausea and/or vomiting occur, drink only clear liquids until the nausea and/or vomiting subsides. Call your physician if vomiting continues.  Special Instructions/Symptoms: Your throat may feel dry or sore from the anesthesia or the breathing tube placed in your throat during surgery. If this causes discomfort, gargle with warm salt water. The discomfort should disappear within 24 hours.  If you had a scopolamine patch placed behind your ear for the management of post- operative nausea and/or vomiting:  1. The medication in the patch is effective for 72 hours, after which it should be removed.  Wrap patch in a tissue and discard in the trash. Wash hands thoroughly with soap and water. 2. You may remove the patch earlier than 72 hours if you experience unpleasant side effects which may include dry mouth, dizziness or visual disturbances. 3. Avoid touching the patch. Wash your hands with soap and water after contact with the patch.   CALL  IF TEMP>100.4, NOTHING PER VAGINA X 1 WK, CALL IF SOAKING A MAXI  PAD EVERY HOUR OR MORE FREQUENTLY

## 2017-06-19 ENCOUNTER — Encounter (HOSPITAL_COMMUNITY): Payer: Self-pay | Admitting: Obstetrics and Gynecology

## 2017-06-19 NOTE — Op Note (Signed)
NAME:  Robin Gordon, Robin Gordon                       ACCOUNT NO.:  MEDICAL RECORD NO.:  [id number]  LOCATION:                                 FACILITY:  PHYSICIAN:  Servando Salina, M.D.    DATE OF BIRTH:  DATE OF PROCEDURE:  06/18/2017 DATE OF DISCHARGE:                              OPERATIVE REPORT   PREOPERATIVE DIAGNOSES:  Menometrorrhagia, submucosal fibroid.  POSTOPERATIVE DIAGNOSES:  Menometrorrhagia, submucosal fibroid.  PROCEDURES:  Diagnostic hysteroscopy, hysteroscopic resection of submucosal fibroid, dilation and curettage.  ANESTHESIA:  General.  SURGEON:  Servando Salina, M.D.  ASSISTANT:  None.  DESCRIPTION OF PROCEDURE:  Under adequate general anesthesia, the patient was placed in a dorsal lithotomy position.  She was sterilely prepped and draped in usual fashion.  Bladder was catheterized for moderate amount of urine.  Examination under anesthesia revealed anteverted uterus.  No adnexal masses could be appreciated.  Bivalve speculum was placed in the vagina.  Single-tooth tenaculum was placed on the anterior lip of the cervix.  The cervix ultimately was dilated up to #27 [facility] dilator.  The diagnostic hysteroscope was introduced into the uterine cavity.  Both tubal ostia could be seen.  There was a small submucosal fibroid noted in the anterior wall.  Posteriorly, there was a larger submucosal fibroid.  Using the MyoSure XL resectoscope, the both fibroids were resected.  The endometrium was also resected using the MyoSure apparatus.  When the resection was complete, all instruments were then removed from the vagina.  SPECIMENS:  Labeled endometrial curetting with submucosal fibroid resection, was sent to Pathology.  COMPLICATIONS:  None.  ESTIMATED BLOOD LOSS:  5 mL.  FLUID DEFICIT:  2400 mL.  SPONGE AND INSTRUMENT COUNT:  Counts x2 was correct.  The patient tolerated the procedure well, was transferred to recovery room in stable  condition.     Servando Salina, M.D.     /MEDQ  D:  06/19/2017  T:  06/19/2017  Job:  [id number]

## 2017-08-25 ENCOUNTER — Other Ambulatory Visit: Payer: Self-pay | Admitting: Obstetrics and Gynecology

## 2017-08-25 DIAGNOSIS — Z1231 Encounter for screening mammogram for malignant neoplasm of breast: Secondary | ICD-10-CM

## 2017-10-08 ENCOUNTER — Ambulatory Visit
Admission: RE | Admit: 2017-10-08 | Discharge: 2017-10-08 | Disposition: A | Discharge status: Home / Self Care | Payer: BC Managed Care – PPO | Source: Ambulatory Visit | Attending: Obstetrics and Gynecology | Admitting: Obstetrics and Gynecology

## 2017-10-08 DIAGNOSIS — Z1231 Encounter for screening mammogram for malignant neoplasm of breast: Secondary | ICD-10-CM

## 2018-01-04 ENCOUNTER — Institutional Professional Consult (permissible substitution): Payer: BC Managed Care – PPO | Admitting: Neurology

## 2018-08-30 ENCOUNTER — Other Ambulatory Visit: Payer: Self-pay | Admitting: Obstetrics and Gynecology

## 2018-08-30 DIAGNOSIS — Z1231 Encounter for screening mammogram for malignant neoplasm of breast: Secondary | ICD-10-CM

## 2018-09-15 ENCOUNTER — Other Ambulatory Visit: Payer: Self-pay | Admitting: Obstetrics and Gynecology

## 2018-09-15 DIAGNOSIS — N644 Mastodynia: Secondary | ICD-10-CM

## 2018-10-04 ENCOUNTER — Ambulatory Visit
Admission: RE | Admit: 2018-10-04 | Discharge: 2018-10-04 | Disposition: A | Discharge status: Home / Self Care | Payer: BC Managed Care – PPO | Source: Ambulatory Visit | Attending: Obstetrics and Gynecology | Admitting: Obstetrics and Gynecology

## 2018-10-04 ENCOUNTER — Ambulatory Visit: Payer: BC Managed Care – PPO

## 2018-10-04 ENCOUNTER — Other Ambulatory Visit: Payer: Self-pay

## 2018-10-04 DIAGNOSIS — N644 Mastodynia: Secondary | ICD-10-CM

## 2018-10-26 ENCOUNTER — Ambulatory Visit: Payer: BC Managed Care – PPO

## 2018-10-28 ENCOUNTER — Ambulatory Visit: Payer: BC Managed Care – PPO

## 2018-12-13 ENCOUNTER — Other Ambulatory Visit: Payer: Self-pay | Admitting: Obstetrics and Gynecology

## 2018-12-13 DIAGNOSIS — D250 Submucous leiomyoma of uterus: Secondary | ICD-10-CM

## 2019-05-18 ENCOUNTER — Other Ambulatory Visit: Payer: Self-pay | Admitting: Family Medicine

## 2019-05-18 DIAGNOSIS — R1013 Epigastric pain: Secondary | ICD-10-CM

## 2019-05-20 ENCOUNTER — Ambulatory Visit
Admission: RE | Admit: 2019-05-20 | Discharge: 2019-05-20 | Disposition: A | Discharge status: Home / Self Care | Payer: BC Managed Care – PPO | Source: Ambulatory Visit | Attending: Family Medicine | Admitting: Family Medicine

## 2019-05-20 ENCOUNTER — Other Ambulatory Visit: Payer: Self-pay | Admitting: Family Medicine

## 2019-05-20 DIAGNOSIS — R1013 Epigastric pain: Secondary | ICD-10-CM

## 2019-05-20 DIAGNOSIS — R0789 Other chest pain: Secondary | ICD-10-CM

## 2019-08-29 ENCOUNTER — Other Ambulatory Visit: Payer: Self-pay | Admitting: Physician Assistant

## 2019-08-29 DIAGNOSIS — R1310 Dysphagia, unspecified: Secondary | ICD-10-CM

## 2019-09-05 ENCOUNTER — Other Ambulatory Visit: Payer: BC Managed Care – PPO

## 2019-10-05 ENCOUNTER — Other Ambulatory Visit: Payer: Self-pay | Admitting: Obstetrics and Gynecology

## 2019-10-05 DIAGNOSIS — Z1231 Encounter for screening mammogram for malignant neoplasm of breast: Secondary | ICD-10-CM

## 2019-10-12 ENCOUNTER — Other Ambulatory Visit: Payer: Self-pay

## 2019-10-12 ENCOUNTER — Ambulatory Visit
Admission: RE | Admit: 2019-10-12 | Discharge: 2019-10-12 | Disposition: A | Discharge status: Home / Self Care | Payer: BC Managed Care – PPO | Source: Ambulatory Visit | Attending: Obstetrics and Gynecology | Admitting: Obstetrics and Gynecology

## 2019-10-12 DIAGNOSIS — Z1231 Encounter for screening mammogram for malignant neoplasm of breast: Secondary | ICD-10-CM

## 2020-04-17 ENCOUNTER — Other Ambulatory Visit: Payer: Self-pay | Admitting: Obstetrics and Gynecology

## 2020-05-01 ENCOUNTER — Encounter (HOSPITAL_BASED_OUTPATIENT_CLINIC_OR_DEPARTMENT_OTHER): Payer: Self-pay | Admitting: Obstetrics and Gynecology

## 2020-05-01 ENCOUNTER — Other Ambulatory Visit: Payer: Self-pay

## 2020-05-01 NOTE — Progress Notes (Signed)
Spoke w/ via phone for pre-op interview---PT Lab needs dos----  URINE PREG POCT             Lab results------HAS LAB APPT 05-03-2020 AT 1300 FOR CBC, BMET T & S COVID test ------05-04-2020 AT 230 PM Arrive at -------600 AM 05-04-2020 NPO after MN NO Solid Food.  Clear liquids from MN until---500 AM THEN NPO Medications to take morning of surgery -----NONE Diabetic medication -----N/A Patient Special Instructions -----NONE Pre-Op special Istructions -----NONE Patient verbalized understanding of instructions that were given at this phone interview. Patient denies shortness of breath, chest pain, fever, cough at this phone interview.

## 2020-05-03 ENCOUNTER — Other Ambulatory Visit: Payer: Self-pay

## 2020-05-03 ENCOUNTER — Other Ambulatory Visit (HOSPITAL_COMMUNITY)
Admission: RE | Admit: 2020-05-03 | Discharge: 2020-05-03 | Disposition: A | Discharge status: Home / Self Care | Payer: BC Managed Care – PPO | Source: Ambulatory Visit | Attending: Obstetrics and Gynecology | Admitting: Obstetrics and Gynecology

## 2020-05-03 ENCOUNTER — Encounter (HOSPITAL_COMMUNITY)
Admission: RE | Admit: 2020-05-03 | Discharge: 2020-05-03 | Disposition: A | Discharge status: Home / Self Care | Payer: BC Managed Care – PPO | Source: Ambulatory Visit | Attending: Obstetrics and Gynecology | Admitting: Obstetrics and Gynecology

## 2020-05-03 ENCOUNTER — Encounter (HOSPITAL_COMMUNITY): Payer: BC Managed Care – PPO

## 2020-05-03 DIAGNOSIS — Z01812 Encounter for preprocedural laboratory examination: Secondary | ICD-10-CM | POA: Insufficient documentation

## 2020-05-03 DIAGNOSIS — N921 Excessive and frequent menstruation with irregular cycle: Secondary | ICD-10-CM | POA: Diagnosis not present

## 2020-05-03 DIAGNOSIS — Z793 Long term (current) use of hormonal contraceptives: Secondary | ICD-10-CM | POA: Diagnosis not present

## 2020-05-03 DIAGNOSIS — Z20822 Contact with and (suspected) exposure to covid-19: Secondary | ICD-10-CM | POA: Insufficient documentation

## 2020-05-03 DIAGNOSIS — D250 Submucous leiomyoma of uterus: Secondary | ICD-10-CM | POA: Diagnosis not present

## 2020-05-03 LAB — CBC
HCT: 34.7 % — ABNORMAL LOW (ref 36.0–46.0)
Hemoglobin: 10.8 g/dL — ABNORMAL LOW (ref 12.0–15.0)
MCH: 27.5 pg (ref 26.0–34.0)
MCHC: 31.1 g/dL (ref 30.0–36.0)
MCV: 88.3 fL (ref 80.0–100.0)
Platelets: 407 10*3/uL — ABNORMAL HIGH (ref 150–400)
RBC: 3.93 MIL/uL (ref 3.87–5.11)
RDW: 11.9 % (ref 11.5–15.5)
WBC: 7.8 10*3/uL (ref 4.0–10.5)
nRBC: 0 % (ref 0.0–0.2)

## 2020-05-03 LAB — BASIC METABOLIC PANEL
Anion gap: 10 (ref 5–15)
BUN: 18 mg/dL (ref 6–20)
CO2: 26 mmol/L (ref 22–32)
Calcium: 9 mg/dL (ref 8.9–10.3)
Chloride: 102 mmol/L (ref 98–111)
Creatinine, Ser: 0.72 mg/dL (ref 0.44–1.00)
GFR, Estimated: >60 mL/min (ref >60)
Glucose, Bld: 107 mg/dL — ABNORMAL HIGH (ref 70–99)
Potassium: 4.1 mmol/L (ref 3.5–5.1)
Sodium: 138 mmol/L (ref 135–145)

## 2020-05-03 NOTE — Anesthesia Preprocedure Evaluation (Addendum)
Anesthesia Evaluation  Patient identified by MRN, date of birth, ID band Patient awake    Reviewed: Allergy & Precautions, NPO status , Patient's Chart, lab work & pertinent test results  History of Anesthesia Complications Negative for: history of anesthetic complications  Airway Mallampati: II  TM Distance: >3 FB Neck ROM: Full    Dental  (+) Dental Advisory Given, Teeth Intact   Pulmonary neg pulmonary ROS,    Pulmonary exam normal        Cardiovascular negative cardio ROS Normal cardiovascular exam     Neuro/Psych  Headaches, negative psych ROS   GI/Hepatic Neg liver ROS, hiatal hernia,   Endo/Other  negative endocrine ROS  Renal/GU negative Renal ROS     Musculoskeletal negative musculoskeletal ROS (+)   Abdominal   Peds  Hematology  (+) anemia ,   Anesthesia Other Findings Covid test negative   Reproductive/Obstetrics                           Anesthesia Physical Anesthesia Plan  ASA: II  Anesthesia Plan: General   Post-op Pain Management:    Induction: Intravenous  PONV Risk Score and Plan: 3 and Treatment may vary due to age or medical condition, Ondansetron, Dexamethasone and Midazolam  Airway Management Planned: LMA  Additional Equipment: None  Intra-op Plan:   Post-operative Plan: Extubation in OR  Informed Consent: I have reviewed the patients History and Physical, chart, labs and discussed the procedure including the risks, benefits and alternatives for the proposed anesthesia with the patient or authorized representative who has indicated his/her understanding and acceptance.     Dental advisory given  Plan Discussed with: CRNA and Anesthesiologist  Anesthesia Plan Comments:        Anesthesia Quick Evaluation

## 2020-05-04 ENCOUNTER — Ambulatory Visit (HOSPITAL_BASED_OUTPATIENT_CLINIC_OR_DEPARTMENT_OTHER): Payer: BC Managed Care – PPO | Admitting: Anesthesiology

## 2020-05-04 ENCOUNTER — Encounter (HOSPITAL_BASED_OUTPATIENT_CLINIC_OR_DEPARTMENT_OTHER)
Admission: RE | Disposition: A | Discharge status: Home / Self Care | Payer: Self-pay | Source: Home / Self Care | Attending: Obstetrics and Gynecology

## 2020-05-04 ENCOUNTER — Ambulatory Visit (HOSPITAL_BASED_OUTPATIENT_CLINIC_OR_DEPARTMENT_OTHER)
Admission: RE | Admit: 2020-05-04 | Discharge: 2020-05-04 | Disposition: A | Discharge status: Home / Self Care | Payer: BC Managed Care – PPO | Attending: Obstetrics and Gynecology | Admitting: Obstetrics and Gynecology

## 2020-05-04 ENCOUNTER — Encounter (HOSPITAL_BASED_OUTPATIENT_CLINIC_OR_DEPARTMENT_OTHER): Payer: Self-pay | Admitting: Obstetrics and Gynecology

## 2020-05-04 DIAGNOSIS — N921 Excessive and frequent menstruation with irregular cycle: Secondary | ICD-10-CM | POA: Diagnosis not present

## 2020-05-04 DIAGNOSIS — Z793 Long term (current) use of hormonal contraceptives: Secondary | ICD-10-CM | POA: Insufficient documentation

## 2020-05-04 DIAGNOSIS — D250 Submucous leiomyoma of uterus: Secondary | ICD-10-CM | POA: Insufficient documentation

## 2020-05-04 DIAGNOSIS — Z20822 Contact with and (suspected) exposure to covid-19: Secondary | ICD-10-CM | POA: Insufficient documentation

## 2020-05-04 HISTORY — PX: DILATATION & CURETTAGE/HYSTEROSCOPY WITH MYOSURE: SHX6511

## 2020-05-04 HISTORY — DX: Presence of spectacles and contact lenses: Z97.3

## 2020-05-04 HISTORY — DX: Migraine, unspecified, not intractable, without status migrainosus: G43.909

## 2020-05-04 HISTORY — DX: Anemia, unspecified: D64.9

## 2020-05-04 HISTORY — DX: Excessive and frequent menstruation with regular cycle: N92.0

## 2020-05-04 HISTORY — PX: DILITATION & CURRETTAGE/HYSTROSCOPY WITH NOVASURE ABLATION: SHX5568

## 2020-05-04 LAB — POCT PREGNANCY, URINE: Preg Test, Ur: NEGATIVE

## 2020-05-04 LAB — TYPE AND SCREEN
ABO/RH(D): O NEG
Antibody Screen: NEGATIVE

## 2020-05-04 LAB — SARS CORONAVIRUS 2 (TAT 6-24 HRS): SARS Coronavirus 2: NEGATIVE

## 2020-05-04 LAB — ABO/RH: ABO/RH(D): O NEG

## 2020-05-04 SURGERY — DILATATION & CURETTAGE/HYSTEROSCOPY WITH NOVASURE ABLATION
Anesthesia: General | Site: Uterus

## 2020-05-04 MED ORDER — SODIUM CHLORIDE 0.9 % IR SOLN
Status: DC | PRN
  Administered 2020-05-04: 6000 mL

## 2020-05-04 MED ORDER — CEFAZOLIN SODIUM-DEXTROSE 2-4 GM/100ML-% IV SOLN
2.0000 g | INTRAVENOUS | Status: AC
  Administered 2020-05-04: 2 g via INTRAVENOUS

## 2020-05-04 MED ORDER — FENTANYL CITRATE (PF) 100 MCG/2ML IJ SOLN
INTRAMUSCULAR | Status: DC | PRN
  Administered 2020-05-04: 25 ug via INTRAVENOUS
  Administered 2020-05-04: 50 ug via INTRAVENOUS
  Administered 2020-05-04: 25 ug via INTRAVENOUS

## 2020-05-04 MED ORDER — DEXAMETHASONE SODIUM PHOSPHATE 10 MG/ML IJ SOLN
INTRAMUSCULAR | Status: AC
  Filled 2020-05-04: qty 1

## 2020-05-04 MED ORDER — LACTATED RINGERS IV SOLN: INTRAVENOUS | Status: DC

## 2020-05-04 MED ORDER — ONDANSETRON HCL 4 MG/2ML IJ SOLN
INTRAMUSCULAR | Status: DC | PRN
  Administered 2020-05-04: 4 mg via INTRAVENOUS

## 2020-05-04 MED ORDER — CEFAZOLIN SODIUM-DEXTROSE 2-4 GM/100ML-% IV SOLN
INTRAVENOUS | Status: AC
  Filled 2020-05-04: qty 100

## 2020-05-04 MED ORDER — POVIDONE-IODINE 10 % EX SWAB: 2.0000 "application " | Freq: Once | CUTANEOUS | Status: DC

## 2020-05-04 MED ORDER — DEXAMETHASONE SODIUM PHOSPHATE 10 MG/ML IJ SOLN
INTRAMUSCULAR | Status: DC | PRN
  Administered 2020-05-04 (×2): 5 mg via INTRAVENOUS

## 2020-05-04 MED ORDER — FENTANYL CITRATE (PF) 100 MCG/2ML IJ SOLN
INTRAMUSCULAR | Status: AC
  Filled 2020-05-04: qty 2

## 2020-05-04 MED ORDER — FENTANYL CITRATE (PF) 100 MCG/2ML IJ SOLN: 25.0000 ug | INTRAMUSCULAR | Status: DC | PRN

## 2020-05-04 MED ORDER — VASOPRESSIN 20 UNIT/ML IV SOLN
INTRAVENOUS | Status: DC | PRN
  Administered 2020-05-04: 7 [IU]

## 2020-05-04 MED ORDER — MIDAZOLAM HCL 2 MG/2ML IJ SOLN
INTRAMUSCULAR | Status: DC | PRN
  Administered 2020-05-04: 2 mg via INTRAVENOUS

## 2020-05-04 MED ORDER — MIDAZOLAM HCL 2 MG/2ML IJ SOLN
INTRAMUSCULAR | Status: AC
  Filled 2020-05-04: qty 2

## 2020-05-04 MED ORDER — BUPIVACAINE HCL (PF) 0.25 % IJ SOLN
INTRAMUSCULAR | Status: DC | PRN
  Administered 2020-05-04: 20 mL

## 2020-05-04 MED ORDER — KETOROLAC TROMETHAMINE 30 MG/ML IJ SOLN
INTRAMUSCULAR | Status: AC
  Filled 2020-05-04: qty 1

## 2020-05-04 MED ORDER — PROPOFOL 10 MG/ML IV BOLUS
INTRAVENOUS | Status: DC | PRN
  Administered 2020-05-04: 200 mg via INTRAVENOUS

## 2020-05-04 MED ORDER — PROPOFOL 10 MG/ML IV BOLUS
INTRAVENOUS | Status: AC
  Filled 2020-05-04: qty 20

## 2020-05-04 MED ORDER — PROMETHAZINE HCL 25 MG/ML IJ SOLN: 6.2500 mg | INTRAMUSCULAR | Status: DC | PRN

## 2020-05-04 MED ORDER — LIDOCAINE HCL (PF) 2 % IJ SOLN
INTRAMUSCULAR | Status: AC
  Filled 2020-05-04: qty 5

## 2020-05-04 MED ORDER — ONDANSETRON HCL 4 MG/2ML IJ SOLN
INTRAMUSCULAR | Status: AC
  Filled 2020-05-04: qty 2

## 2020-05-04 MED ORDER — OXYCODONE HCL 5 MG PO TABS
5.0000 mg | ORAL_TABLET | Freq: Once | ORAL | Status: DC | PRN
Start: 2020-05-04 — End: 2020-05-04

## 2020-05-04 MED ORDER — TRAMADOL HCL 50 MG PO TABS: 50.0000 mg | ORAL_TABLET | Freq: Four times a day (QID) | ORAL | 0 refills | Status: DC | PRN

## 2020-05-04 MED ORDER — LIDOCAINE 2% (20 MG/ML) 5 ML SYRINGE
INTRAMUSCULAR | Status: DC | PRN
  Administered 2020-05-04: 60 mg via INTRAVENOUS

## 2020-05-04 MED ORDER — SODIUM CHLORIDE (PF) 0.9 % IJ SOLN
INTRAMUSCULAR | Status: DC | PRN
  Administered 2020-05-04: 17.5 mL

## 2020-05-04 MED ORDER — OXYCODONE HCL 5 MG/5ML PO SOLN: 5.0000 mg | Freq: Once | ORAL | Status: DC | PRN

## 2020-05-04 MED ORDER — KETOROLAC TROMETHAMINE 30 MG/ML IJ SOLN
INTRAMUSCULAR | Status: DC | PRN
  Administered 2020-05-04: 30 mg via INTRAVENOUS

## 2020-05-04 SURGICAL SUPPLY — 29 items
ABLATOR SURESOUND NOVASURE (ABLATOR) ×3 IMPLANT
BAG DECANTER FOR FLEXI CONT (MISCELLANEOUS) ×1 IMPLANT
CATH ROBINSON RED A/P 16FR (CATHETERS) ×2 IMPLANT
DECANTER SPIKE VIAL GLASS SM (MISCELLANEOUS) ×4 IMPLANT
DEVICE MYOSURE LITE (MISCELLANEOUS) IMPLANT
DEVICE MYOSURE REACH (MISCELLANEOUS) IMPLANT
GAUZE VASELINE 3X9 (GAUZE/BANDAGES/DRESSINGS) ×1 IMPLANT
GLOVE BIO SURGEON STRL SZ7.5 (GLOVE) ×2 IMPLANT
GLOVE BIOGEL PI IND STRL 7.0 (GLOVE) ×1 IMPLANT
GLOVE BIOGEL PI INDICATOR 7.0 (GLOVE) ×1
GOWN STRL REUS W/ TWL LRG LVL3 (GOWN DISPOSABLE) ×2 IMPLANT
GOWN STRL REUS W/TWL LRG LVL3 (GOWN DISPOSABLE) ×8 IMPLANT
HIBICLENS CHG 4% 4OZ (MISCELLANEOUS) ×2 IMPLANT
IV NS IRRIG 3000ML ARTHROMATIC (IV SOLUTION) ×2 IMPLANT
KIT PROCEDURE FLUENT (KITS) ×2 IMPLANT
KIT TURNOVER CYSTO (KITS) ×2 IMPLANT
MYOSURE XL FIBROID (MISCELLANEOUS) ×2
NDL SPNL 22GX3.5 QUINCKE BK (NEEDLE) ×1 IMPLANT
NEEDLE HYPO 22GX1.5 SAFETY (NEEDLE) ×1 IMPLANT
NEEDLE SPNL 22GX3.5 QUINCKE BK (NEEDLE) ×2 IMPLANT
PACK VAGINAL MINOR WOMEN LF (CUSTOM PROCEDURE TRAY) ×2 IMPLANT
PAD OB MATERNITY 4.3X12.25 (PERSONAL CARE ITEMS) ×2 IMPLANT
PAD PREP 24X48 CUFFED NSTRL (MISCELLANEOUS) ×2 IMPLANT
SEAL CERVICAL OMNI LOK (ABLATOR) ×1 IMPLANT
SEAL ROD LENS SCOPE MYOSURE (ABLATOR) ×2 IMPLANT
SYR CONTROL 10ML LL (SYRINGE) ×2 IMPLANT
SYR TB 1ML LL NO SAFETY (SYRINGE) ×1 IMPLANT
SYSTEM TISS REMOVAL MYOSURE XL (MISCELLANEOUS) IMPLANT
TOWEL OR 17X26 10 PK STRL BLUE (TOWEL DISPOSABLE) ×2 IMPLANT

## 2020-05-04 NOTE — Anesthesia Procedure Notes (Signed)
Procedure Name: LMA Insertion Date/Time: 05/04/2020 7:55 AM Performed by: Suan Halter, CRNA Pre-anesthesia Checklist: Patient identified, Emergency Drugs available, Suction available and Patient being monitored Patient Re-evaluated:Patient Re-evaluated prior to induction Oxygen Delivery Method: Circle system utilized Preoxygenation: Pre-oxygenation with 100% oxygen Induction Type: IV induction Ventilation: Mask ventilation without difficulty LMA: LMA inserted LMA Size: 4.0 Number of attempts: 1 Airway Equipment and Method: Bite block Placement Confirmation: positive ETCO2 Tube secured with: Tape Dental Injury: Teeth and Oropharynx as per pre-operative assessment

## 2020-05-04 NOTE — Anesthesia Postprocedure Evaluation (Signed)
Anesthesia Post Note  Patient: Robin Gordon  Procedure(s) Performed: DILATATION & CURETTAGE/HYSTEROSCOPY and attempted novasure (N/A Uterus) Possible MYOSURE Resection (N/A Uterus)     Patient location during evaluation: PACU Anesthesia Type: General Level of consciousness: awake and alert Pain management: pain level controlled Vital Signs Assessment: post-procedure vital signs reviewed and stable Respiratory status: spontaneous breathing, nonlabored ventilation and respiratory function stable Cardiovascular status: blood pressure returned to baseline and stable Postop Assessment: no apparent nausea or vomiting Anesthetic complications: no   No complications documented.  Last Vitals:  Vitals:   05/04/20 0930 05/04/20 1015  BP: 115/72 130/77  Pulse: 65 77  Resp: (!) 8 14  Temp:  36.6 C  SpO2: 100% 100%    Last Pain:  Vitals:   05/04/20 1015  TempSrc:   PainSc: [place]

## 2020-05-04 NOTE — Progress Notes (Signed)
Patient seen and examined. Consent witnessed and signed. No changes noted. Update completed. BP (!) 143/79   Pulse 90   Temp 97.7 F (36.5 C) (Oral)   Resp 16   Ht 5\' 6"  (1.676 m)   Wt 63.4 kg   LMP 04/24/2020   SpO2 100%   BMI 22.56 kg/m   CBC    Component Value Date/Time   WBC 7.8 05/03/2020 1406   RBC 3.93 05/03/2020 1406   HGB 10.8 (L) 05/03/2020 1406   HCT 34.7 (L) 05/03/2020 1406   PLT 407 (H) 05/03/2020 1406   MCV 88.3 05/03/2020 1406   MCH 27.5 05/03/2020 1406   MCHC 31.1 05/03/2020 1406   RDW 11.9 05/03/2020 1406

## 2020-05-04 NOTE — Transfer of Care (Signed)
Immediate Anesthesia Transfer of Care Note  Patient: Robin Gordon  Procedure(s) Performed: Procedure(s) (LRB): DILATATION & CURETTAGE/HYSTEROSCOPY and attempted novasure (N/A) Possible MYOSURE Resection (N/A)  Patient Location: PACU  Anesthesia Type: General  Level of Consciousness: awake, oriented, sedated and patient cooperative  Airway & Oxygen Therapy: Patient Spontanous Breathing and Patient connected to face mask oxygen  Post-op Assessment: Report given to PACU RN and Post -op Vital signs reviewed and stable  Post vital signs: Reviewed and stable  Complications: No apparent anesthesia complications Last Vitals:  Vitals Value Taken Time  BP 120/81 05/04/20 0850  Temp 36.8 C 05/04/20 0850  Pulse 69 05/04/20 0857  Resp 10 05/04/20 0857  SpO2 100 % 05/04/20 0857  Vitals shown include unvalidated device data.  Last Pain:  Vitals:   05/04/20 0634  TempSrc: Oral  PainSc: 0-No pain      Patients Stated Pain Goal: 5 (97/41/63 [id number])  Complications: No complications documented.

## 2020-05-04 NOTE — H&P (Signed)
Robin Gordon is an 50 y.o. female. AUB with structural lesion and secondary anemia for surgical evaluation  Pertinent Gynecological History: Menses: flow is moderate Bleeding: dysfunctional uterine bleeding Contraception: none DES exposure: denies Blood transfusions: none Sexually transmitted diseases: no past history Previous GYN Procedures: DNC  Last mammogram: normal Date: 2020 Last pap: normal Date: 2020 OB History: G4, P2   Menstrual History: Menarche age: 47 Patient's last menstrual period was 04/24/2020.    Past Medical History:  Diagnosis Date  . Anemia   . History of hiatal hernia   . Menorrhagia   . Migraine   . SVD (spontaneous vaginal delivery)    x 2  . Wears glasses     Past Surgical History:  Procedure Laterality Date  . COLONOSCOPY  LAST DONE 2017   several  . DILATATION & CURETTAGE/HYSTEROSCOPY WITH MYOSURE N/A 06/18/2017   Procedure: DILATATION & CURETTAGE/HYSTEROSCOPY WITH MYOSURE XL;  Surgeon: Servando Salina, MD;  Location: [facility];  Service: Gynecology;  Laterality: N/A;  . UPPER GI ENDOSCOPY  2017   hiatal hernia  . WISDOM TOOTH EXTRACTION    . WRIST SURGERY Left 2012    Family History  Problem Relation Age of Onset  . Breast cancer Neg Hx     Social History:  reports that she has never smoked. She has never used smokeless tobacco. She reports current alcohol use of about 2.0 standard drinks of alcohol per week. She reports that she does not use drugs.  Allergies: No Known Allergies  Medications Prior to Admission  Medication Sig Dispense Refill Last Dose  . acetaminophen (TYLENOL) 500 MG tablet Take 500 mg by mouth every 6 (six) hours as needed.     . Cyanocobalamin (VITAMIN B 12 PO) Take by mouth.     . Iron, Ferrous Sulfate, 325 (65 Fe) MG TABS Take by mouth daily.     . Multiple Vitamins-Minerals (MULTIVITAMIN WITH MINERALS) tablet Take 1 tablet by mouth daily.     Marland Kitchen OVER THE COUNTER MEDICATION VITAMIN D Q DAY     . OVER THE  COUNTER MEDICATION BIOTIN/COLLAGEN Q DAY     . Calcium Carb-Cholecalciferol (CALCIUM-VITAMIN D) 500-200 MG-UNIT tablet Take 1 tablet by mouth daily.     Marland Kitchen ibuprofen (ADVIL) 200 MG tablet Take 200 mg by mouth every 6 (six) hours as needed. 3 TABS PRN   04/24/2020  . norethindrone-ethinyl estradiol (JUNEL FE,GILDESS FE,LOESTRIN FE) 1-20 MG-MCG tablet Take 1 tablet by mouth at bedtime. Takes continuously       Review of Systems  Constitutional: Negative.   All other systems reviewed and are negative. Ht 5\' 6"  (1.676 m)   Wt 62.1 kg   LMP 04/24/2020   BMI 22.11 kg/m   CBC    Component Value Date/Time   WBC 7.8 05/03/2020 1406   RBC 3.93 05/03/2020 1406   HGB 10.8 (L) 05/03/2020 1406   HCT 34.7 (L) 05/03/2020 1406   PLT 407 (H) 05/03/2020 1406   MCV 88.3 05/03/2020 1406   MCH 27.5 05/03/2020 1406   MCHC 31.1 05/03/2020 1406   RDW 11.9 05/03/2020 1406     Height 5\' 6"  (1.676 m), weight 62.1 kg, last menstrual period 04/24/2020. Physical Exam Vitals and nursing note reviewed.  Constitutional:      Appearance: Normal appearance.  HENT:     Head: Normocephalic and atraumatic.  Cardiovascular:     Rate and Rhythm: Normal rate and regular rhythm.     Pulses: Normal pulses.  Heart sounds: Normal heart sounds.  Pulmonary:     Effort: Pulmonary effort is normal.     Breath sounds: Normal breath sounds.  Abdominal:     General: Abdomen is flat.     Palpations: Abdomen is soft.  Genitourinary:    General: Normal vulva.  Musculoskeletal:        General: Normal range of motion.     Cervical back: Normal range of motion and neck supple.  Skin:    General: Skin is warm and dry.  Neurological:     General: No focal deficit present.     Mental Status: She is alert and oriented to person, place, and time.  Psychiatric:        Mood and Affect: Mood normal.        Behavior: Behavior normal.     Results for orders placed or performed during the hospital encounter of 05/04/20  (from the past 24 hour(s))  Pregnancy, urine POC     Status: None   Collection Time: 05/04/20  6:10 AM  Result Value Ref Range   Preg Test, Ur NEGATIVE NEGATIVE    No results found.  Assessment/Plan: AUB with SM fibroid Diag HS, D&C, myosure, EAB Surgical consent done. Risks vs benefits of surgical intervention discussed.  Mairyn Lenahan J 05/04/2020, 6:26 AM

## 2020-05-04 NOTE — Op Note (Signed)
Robin Gordon, Robin Gordon MEDICAL RECORD DE:[id number] ACCOUNT [id number] DATE OF BIRTH:[birth year] FACILITY: [facility] LOCATION: [facility] PHYSICIAN:Johnedward Brodrick J. Kellin Bartling, MD  OPERATIVE REPORT  DATE OF PROCEDURE:  05/04/2020  PREOPERATIVE DIAGNOSIS:  Menometrorrhagia with structural lesion on saline sonohysterogram.  POSTOPERATIVE DIAGNOSIS:  Menometrorrhagia with structural lesion on saline sonohysterogram.  PROCEDURE:  Diagnostic hysteroscopy, dilatation and curettage, MyoSure resection of submucous fibroid on the anterior and posterior wall, failed NovaSure endometrial ablation.  SURGEON:  Brien Few, MD  ASSISTANT:  None.  ANESTHESIA:  Local, general.  ESTIMATED BLOOD LOSS:  Less than 50 mL.  FLUID DEFICIT:  400 mL.  COMPLICATIONS:  None.  DRAINS:  None.  COUNTS:  Correct.  DISPOSITION:  The patient was taken to recovery in good condition.  BRIEF OPERATIVE NOTE:  After being apprised of the risks of anesthesia, infection, bleeding, injury to surrounding organs, possible need for repair, delayed versus immediate complications including bowel and bladder injury, possible need for repair,  the  patient was brought to the operating room where she was administered a general anesthetic without complications.  She was prepped and draped in usual sterile fashion.  Feet are placed in [place].  After achieving adequate anesthesia and exam  under anesthesia reveals some bulky anteflexed uterus and no adnexal masses.  Dilute Pitressin solution was placed at 3 and 9 o'clock, 20 mL, total dilute Marcaine solution placed with the standard paracervical block, 20 mL total.  Cervix easily dilated  up to #23 Pratt dilator.  Hysteroscope placed.  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  There is a small  posterior wall submucous fibroid and a right  cornual submucous fibroid as well.  At this time, the MyoSure device XL was placed without difficulty.  Resection of posterior wall fibroid was done without difficulty.  The scar tissue was undermined using  the MyoSure and resected creating an otherwise normal cavity and an anterior wall fibroid is resected in multiple passes using the MyoSure resection device, the small pinpoint probably less than 0.1 cm cornual fibroid is unable to be resected with the  MyoSure device.  At this time, D and C performed using sharp curettage in a 4-quadrant method and then the NovaSure device was placed without difficulty.  Seated to a length of 6.4 and a width of 4.7 after trying to establish a CO2 testing, failure is  accomplished on 3 occasions.  At this time, the tip of the device was wrapped using Vaseline gauze and failure occurs again.  The tip of this was changed to an ____ device with no success on 2 other occasions.  At this time, due to device failure a  similar tries are made with a second device without success and passing the CO2 test.  At this time, the procedure was terminated.  The patient was awakened and transferred to recovery in good condition.  HN/NUANCE  D:05/04/2020 T:05/04/2020 JOB:[id number]

## 2020-05-04 NOTE — Discharge Instructions (Signed)
  Post Anesthesia Home Care Instructions  Activity: Get plenty of rest for the remainder of the day. A responsible individual must stay with you for 24 hours following the procedure.  For the next 24 hours, DO NOT: -Drive a car -Paediatric nurse -Drink alcoholic beverages -Take any medication unless instructed by your physician -Make any legal decisions or sign important papers.  Meals: Start with liquid foods such as gelatin or soup. Progress to regular foods as tolerated. Avoid greasy, spicy, heavy foods. If nausea and/or vomiting occur, drink only clear liquids until the nausea and/or vomiting subsides. Call your physician if vomiting continues.  Special Instructions/Symptoms: Your throat may feel dry or sore from the anesthesia or the breathing tube placed in your throat during surgery. If this causes discomfort, gargle with warm salt water. The discomfort should disappear within 24 hours.    D & C Home care Instructions:   Personal hygiene:  Used sanitary napkins for vaginal drainage not tampons. Shower or tub bathe the day after your procedure. No douching until bleeding stops. Always wipe from front to back after  Elimination.  Activity: Do not drive or operate any equipment today. The effects of the anesthesia are still present and drowsiness may result. Rest today, not necessarily flat bed rest, just take it easy. You may resume your normal activity in one to 2 days.  Sexual activity: No intercourse for one week or as indicated by your physician  Diet: Eat a light diet as desired this evening. You may resume a regular diet tomorrow.  Return to work: One to 2 days.  General Expectations of your surgery: Vaginal bleeding should be no heavier than a normal period. Spotting may continue up to 10 days. Mild cramps may continue for a couple of days. You may have a regular period in 2-6 weeks.  Unexpected observations call your doctor if these occur: persistent or heavy bleeding.  Severe abdominal cramping or pain. Elevation of temperature greater than 100F.   May take Ibuprofen after 2 PM as needed for pain.

## 2020-05-04 NOTE — Op Note (Signed)
05/04/2020  8:37 AM  PATIENT:  Robin Gordon  50 y.o. female  PRE-OPERATIVE DIAGNOSIS:  Menometrorrhagia  POST-OPERATIVE DIAGNOSIS:  Menometrorrhagia  PROCEDURE:  Procedure(s): DILATATION & CURETTAGE/HYSTEROSCOPY  FAILED  NOVASURE ABLATION MYOSURE Resection OF ANT/POST SM FIBROID  SURGEON:  Surgeon(s): Brien Few, MD  ASSISTANTS: none   ANESTHESIA:   local and general  ESTIMATED BLOOD LOSS: MINIMAL   DRAINS: none   LOCAL MEDICATIONS USED:  MARCAINE    and Amount: 20 ml  SPECIMEN:  Source of Specimen:  FIBROID AND ENDO CURETTINGS  DISPOSITION OF SPECIMEN:  PATHOLOGY  COUNTS:  YES  DICTATION #: [id number]  PLAN OF CARE: DC HOME  PATIENT DISPOSITION:  PACU - hemodynamically stable.

## 2020-05-07 ENCOUNTER — Encounter (HOSPITAL_BASED_OUTPATIENT_CLINIC_OR_DEPARTMENT_OTHER): Payer: Self-pay | Admitting: Obstetrics and Gynecology

## 2020-05-07 LAB — SURGICAL PATHOLOGY

## 2020-09-19 ENCOUNTER — Other Ambulatory Visit: Payer: Self-pay | Admitting: Obstetrics and Gynecology

## 2020-09-19 DIAGNOSIS — Z1231 Encounter for screening mammogram for malignant neoplasm of breast: Secondary | ICD-10-CM

## 2020-11-15 ENCOUNTER — Other Ambulatory Visit: Payer: Self-pay

## 2020-11-15 ENCOUNTER — Ambulatory Visit
Admission: RE | Admit: 2020-11-15 | Discharge: 2020-11-15 | Disposition: A | Discharge status: Home / Self Care | Payer: BC Managed Care – PPO | Source: Ambulatory Visit | Attending: Obstetrics and Gynecology | Admitting: Obstetrics and Gynecology

## 2020-11-15 DIAGNOSIS — Z1231 Encounter for screening mammogram for malignant neoplasm of breast: Secondary | ICD-10-CM

## 2020-11-19 ENCOUNTER — Other Ambulatory Visit: Payer: Self-pay | Admitting: Family Medicine

## 2020-11-19 DIAGNOSIS — R519 Headache, unspecified: Secondary | ICD-10-CM

## 2020-11-20 ENCOUNTER — Ambulatory Visit
Admission: RE | Admit: 2020-11-20 | Discharge: 2020-11-20 | Disposition: A | Discharge status: Home / Self Care | Payer: BC Managed Care – PPO | Source: Ambulatory Visit | Attending: Family Medicine | Admitting: Family Medicine

## 2020-11-20 ENCOUNTER — Other Ambulatory Visit: Payer: Self-pay

## 2020-11-20 DIAGNOSIS — R519 Headache, unspecified: Secondary | ICD-10-CM

## 2020-11-21 ENCOUNTER — Emergency Department (HOSPITAL_COMMUNITY): Payer: BC Managed Care – PPO

## 2020-11-21 ENCOUNTER — Emergency Department (HOSPITAL_COMMUNITY)
Admission: EM | Admit: 2020-11-21 | Discharge: 2020-11-21 | Disposition: A | Discharge status: Home / Self Care | Payer: BC Managed Care – PPO | Attending: Emergency Medicine | Admitting: Emergency Medicine

## 2020-11-21 DIAGNOSIS — Z20822 Contact with and (suspected) exposure to covid-19: Secondary | ICD-10-CM | POA: Insufficient documentation

## 2020-11-21 DIAGNOSIS — R519 Headache, unspecified: Secondary | ICD-10-CM | POA: Insufficient documentation

## 2020-11-21 DIAGNOSIS — M542 Cervicalgia: Secondary | ICD-10-CM | POA: Insufficient documentation

## 2020-11-21 DIAGNOSIS — G43909 Migraine, unspecified, not intractable, without status migrainosus: Secondary | ICD-10-CM | POA: Diagnosis not present

## 2020-11-21 LAB — CBC WITH DIFFERENTIAL/PLATELET
Abs Immature Granulocytes: 0.04 10*3/uL (ref 0.00–0.07)
Basophils Absolute: 0.1 10*3/uL (ref 0.0–0.1)
Basophils Relative: 1 %
Eosinophils Absolute: 0.2 10*3/uL (ref 0.0–0.5)
Eosinophils Relative: 3 %
HCT: 42.6 % (ref 36.0–46.0)
Hemoglobin: 13.9 g/dL (ref 12.0–15.0)
Immature Granulocytes: 1 %
Lymphocytes Relative: 22 %
Lymphs Abs: 1.9 10*3/uL (ref 0.7–4.0)
MCH: 29.4 pg (ref 26.0–34.0)
MCHC: 32.6 g/dL (ref 30.0–36.0)
MCV: 90.3 fL (ref 80.0–100.0)
Monocytes Absolute: 0.8 10*3/uL (ref 0.1–1.0)
Monocytes Relative: 10 %
Neutro Abs: 5.5 10*3/uL (ref 1.7–7.7)
Neutrophils Relative %: 63 %
Platelets: 295 10*3/uL (ref 150–400)
RBC: 4.72 MIL/uL (ref 3.87–5.11)
RDW: 12.5 % (ref 11.5–15.5)
WBC: 8.5 10*3/uL (ref 4.0–10.5)
nRBC: 0 % (ref 0.0–0.2)

## 2020-11-21 LAB — CBG MONITORING, ED: Glucose-Capillary: 116 mg/dL — ABNORMAL HIGH (ref 70–99)

## 2020-11-21 LAB — COMPREHENSIVE METABOLIC PANEL
ALT: 23 U/L (ref 0–44)
AST: 24 U/L (ref 15–41)
Albumin: 4.3 g/dL (ref 3.5–5.0)
Alkaline Phosphatase: 58 U/L (ref 38–126)
Anion gap: 6 (ref 5–15)
BUN: 15 mg/dL (ref 6–20)
CO2: 28 mmol/L (ref 22–32)
Calcium: 9.4 mg/dL (ref 8.9–10.3)
Chloride: 102 mmol/L (ref 98–111)
Creatinine, Ser: 0.68 mg/dL (ref 0.44–1.00)
GFR, Estimated: >60 mL/min (ref >60)
Glucose, Bld: 104 mg/dL — ABNORMAL HIGH (ref 70–99)
Potassium: 4 mmol/L (ref 3.5–5.1)
Sodium: 136 mmol/L (ref 135–145)
Total Bilirubin: 0.9 mg/dL (ref 0.3–1.2)
Total Protein: 7.2 g/dL (ref 6.5–8.1)

## 2020-11-21 LAB — RESP PANEL BY RT-PCR (FLU A&B, COVID) ARPGX2
Influenza A by PCR: NEGATIVE
Influenza B by PCR: NEGATIVE
SARS Coronavirus 2 by RT PCR: NEGATIVE

## 2020-11-21 LAB — POC URINE PREG, ED: Preg Test, Ur: NEGATIVE

## 2020-11-21 MED ORDER — SODIUM CHLORIDE 0.9 % IV BOLUS
1000.0000 mL | Freq: Once | INTRAVENOUS | Status: AC
  Administered 2020-11-21: 1000 mL via INTRAVENOUS

## 2020-11-21 MED ORDER — ONDANSETRON 4 MG PO TBDP
4.0000 mg | ORAL_TABLET | Freq: Once | ORAL | Status: AC
  Administered 2020-11-21: 4 mg via ORAL
  Filled 2020-11-21: qty 1

## 2020-11-21 MED ORDER — PROCHLORPERAZINE EDISYLATE 10 MG/2ML IJ SOLN
10.0000 mg | Freq: Once | INTRAMUSCULAR | Status: AC
  Administered 2020-11-21: 10 mg via INTRAVENOUS
  Filled 2020-11-21: qty 2

## 2020-11-21 MED ORDER — OXYCODONE-ACETAMINOPHEN 5-325 MG PO TABS
1.0000 | ORAL_TABLET | Freq: Once | ORAL | Status: AC
  Administered 2020-11-21: 1 via ORAL
  Filled 2020-11-21: qty 1

## 2020-11-21 MED ORDER — GADOBUTROL 1 MMOL/ML IV SOLN
6.5000 mL | Freq: Once | INTRAVENOUS | Status: AC | PRN
  Administered 2020-11-21: 6.5 mL via INTRAVENOUS

## 2020-11-21 MED ORDER — DIPHENHYDRAMINE HCL 50 MG/ML IJ SOLN
25.0000 mg | Freq: Once | INTRAMUSCULAR | Status: AC
  Administered 2020-11-21: 25 mg via INTRAVENOUS
  Filled 2020-11-21: qty 1

## 2020-11-21 NOTE — ED Provider Notes (Signed)
[facility] EMERGENCY DEPARTMENT Provider Note   CSN: [id number] Arrival date & time: 11/21/20  0117     History Chief Complaint  Patient presents with   Headache    Robin Gordon is a 50 y.o. female.  HPI     Is a 50 year old female who presents with headache.  Patient reports she has a history of migraines; however, since early July she has had recurrent headaches are not typical for her migraines.  She states on July 3 she had a headache that she described as sharp and stabbing.  It lasted all day.  It did not get any better with Excedrin.  Normally her headaches improved with Excedrin.  Headache lasted for 3 to 4 days.  She states that the same character headache returned the next week.  She states that she continues to have electrical pain sensations that are shooting.  She also has reported some neck discomfort and poor sleep.  No vision deficits, weakness, numbness, tingling, strokelike symptoms.  She is not had any recent fevers.  She saw her primary physician who initially gave her a triptan that did not work.  Subsequently she was placed on prednisone.  She states that normally this would help her but that has not helped either.  She was seen in the office yesterday and given Toradol.  She had a CT scan for which she does not know the results.  Currently she rates her pain at 8 out of 10.  She also reports significant neck pain.  Past Medical History:  Diagnosis Date   Anemia    History of hiatal hernia    Menorrhagia    Migraine    SVD (spontaneous vaginal delivery)    x 2   Wears glasses     There are no problems to display for this patient.   Past Surgical History:  Procedure Laterality Date   COLONOSCOPY  LAST DONE 2017   several   DILATATION & CURETTAGE/HYSTEROSCOPY WITH MYOSURE N/A 06/18/2017   Procedure: DILATATION & CURETTAGE/HYSTEROSCOPY WITH MYOSURE XL;  Surgeon: Servando Salina, MD;  Location: [facility];  Service: Gynecology;  Laterality:  N/A;   DILATATION & CURETTAGE/HYSTEROSCOPY WITH MYOSURE N/A 05/04/2020   Procedure: Possible MYOSURE Resection;  Surgeon: Brien Few, MD;  Location: [place];  Service: Gynecology;  Laterality: N/A;   DILITATION & CURRETTAGE/HYSTROSCOPY WITH NOVASURE ABLATION N/A 05/04/2020   Procedure: DILATATION & CURETTAGE/HYSTEROSCOPY and attempted novasure;  Surgeon: Brien Few, MD;  Location: [place];  Service: Gynecology;  Laterality: N/A;   UPPER GI ENDOSCOPY  2017   hiatal hernia   WISDOM TOOTH EXTRACTION     WRIST SURGERY Left 2012     OB History   No obstetric history on file.     Family History  Problem Relation Age of Onset   Breast cancer Neg Hx     Social History   Tobacco Use   Smoking status: Never   Smokeless tobacco: Never  Vaping Use   Vaping Use: Never used  Substance Use Topics   Alcohol use: Yes    Alcohol/week: 2.0 standard drinks    Types: 2 Glasses of wine per week    Comment: SOCIAL   Drug use: No    Home Medications Prior to Admission medications   Medication Sig Start Date End Date Taking? Authorizing Provider  traMADol (ULTRAM) 50 MG tablet Take 1-2 tablets (50-100 mg total) by mouth every 6 (six) hours as needed. 05/04/20  Brien Few, MD    Allergies    Patient has no known allergies.  Review of Systems   Review of Systems  Constitutional:  Negative for fever.  Respiratory:  Negative for shortness of breath.   Cardiovascular:  Negative for chest pain.  Gastrointestinal:  Negative for abdominal pain, nausea and vomiting.  Neurological:  Positive for headaches. Negative for weakness and numbness.  All other systems reviewed and are negative.  Physical Exam Updated Vital Signs BP 134/77   Pulse 97   Temp 97.9 F (36.6 C) (Oral)   Resp 11   LMP 10/26/2020   SpO2 100%   Physical Exam Vitals and nursing note reviewed.  Constitutional:      Appearance: She is well-developed.  HENT:     Head:  Normocephalic and atraumatic.     Mouth/Throat:     Mouth: Mucous membranes are moist.  Eyes:     Pupils: Pupils are equal, round, and reactive to light.  Cardiovascular:     Rate and Rhythm: Normal rate and regular rhythm.     Heart sounds: Normal heart sounds.  Pulmonary:     Effort: Pulmonary effort is normal. No respiratory distress.     Breath sounds: No wheezing.  Abdominal:     General: Bowel sounds are normal.     Palpations: Abdomen is soft.  Musculoskeletal:     Cervical back: Neck supple.  Skin:    General: Skin is warm and dry.  Neurological:     Mental Status: She is alert and oriented to person, place, and time.     Comments: Cranial nerves II through XII intact, 5 out of 5 strength in all 4 extremities, no dysmetria to finger-nose-finger, no drift  Psychiatric:        Mood and Affect: Mood normal.    ED Results / Procedures / Treatments   Labs (all labs ordered are listed, but only abnormal results are displayed) Labs Reviewed  COMPREHENSIVE METABOLIC PANEL - Abnormal; Notable for the following components:      Result Value   Glucose, Bld 104 (*)    All other components within normal limits  CBG MONITORING, ED - Abnormal; Notable for the following components:   Glucose-Capillary 116 (*)    All other components within normal limits  RESP PANEL BY RT-PCR (FLU A&B, COVID) [id number]  CBC WITH DIFFERENTIAL/PLATELET  URINALYSIS, ROUTINE W REFLEX MICROSCOPIC  POC URINE PREG, ED    EKG EKG Interpretation  Date/Time:  Wednesday November 21 2020 03:21:54 EDT Ventricular Rate:  49 PR Interval:  124 QRS Duration: 78 QT Interval:  474 QTC Calculation: 428 R Axis:   78 Text Interpretation: Sinus bradycardia Otherwise normal ECG Confirmed by Thayer Jew [phone]) on 11/21/2020 3:26:16 AM  Radiology CT HEAD WO CONTRAST  Result Date: 11/20/2020 CLINICAL DATA:  Nonintractable headache, unspecified chronicity pattern, unspecified headache type R51.9 (ICD-10-CM).  Additional history provided: Patient reports headaches for 3 weeks, dizziness and pressure, arm scalp paresthesias. EXAM: CT HEAD WITHOUT CONTRAST TECHNIQUE: Contiguous axial images were obtained from the base of the skull through the vertex without intravenous contrast. COMPARISON:  Report from CT of the paranasal sinuses 12/03/1999 (images unavailable). FINDINGS: Brain: Cerebral volume is normal. There is no acute intracranial hemorrhage. No demarcated cortical infarct. No extra-axial fluid collection. No evidence of an intracranial mass. No midline shift. Vascular: No hyperdense vessel. Skull: Normal. Negative for fracture or focal lesion. Sinuses/Orbits: Visualized orbits show no acute finding. Small mucous retention cyst within an anterior  left ethmoid air cell. IMPRESSION: Unremarkable non-contrast CT appearance of the brain. No evidence of acute intracranial abnormality. Small left ethmoid sinus mucous retention cyst. Electronically Signed   By: Kellie Simmering DO   On: 11/20/2020 19:47   MR ANGIO HEAD WO CONTRAST  Result Date: 11/21/2020 CLINICAL DATA:  50 year old female with headache for 3 weeks. Constant symptoms for multiple days. Electric sensation in body. EXAM: MRI HEAD WITHOUT AND WITH CONTRAST MRA HEAD WITHOUT CONTRAST MRA NECK WITHOUT AND WITH CONTRAST TECHNIQUE: Multiplanar, multiecho pulse sequences of the brain and surrounding structures were obtained without and with intravenous contrast. Angiographic images of the Circle of Willis were obtained using MRA technique without intravenous contrast. Angiographic images of the neck were obtained using MRA technique without and with intravenous contrast. Carotid stenosis measurements (when applicable) are obtained utilizing NASCET criteria, using the distal internal carotid diameter as the denominator. CONTRAST:  6.22m GADAVIST GADOBUTROL 1 MMOL/ML IV SOLN COMPARISON:  Head CT 11/20/2020. FINDINGS: MRI HEAD FINDINGS Brain: Normal cerebral volume. No  restricted diffusion to suggest acute infarction. No midline shift, mass effect, evidence of mass lesion, ventriculomegaly, extra-axial collection or acute intracranial hemorrhage. Cervicomedullary junction and pituitary are within normal limits. Cystic change of the pineal gland measuring about 12 mm (series 9, image 14 and series 7, image 12. No regional mass effect. No abnormal enhancement following contrast. Normal for age gray and white matter signal throughout the brain. No encephalomalacia or chronic cerebral blood products identified. No abnormal enhancement identified. No dural thickening. Vascular: Major intracranial vascular flow voids are preserved. The left vertebral artery appears mildly dominant. The major dural venous sinuses are enhancing and appear to be patent. Skull and upper cervical spine: Negative visible cervical spine. Normal bone marrow signal. Sinuses/Orbits: Negative orbits. Paranasal sinuses and mastoids are stable and well aerated. Other: Grossly normal visible internal auditory structures. Symmetric and negative visible scalp and face soft tissues. MRA NECK FINDINGS Precontrast time-of-flight imaging demonstrates antegrade flow in the bilateral cervical carotid arteries and codominant cervical vertebral arteries. Normal time-of-flight appearance of both carotid bifurcations. Following contrast a 3 vessel arch configuration is demonstrated. Proximal great vessels appear normal. Right CCA, right carotid bifurcation and cervical right ICA are normal. Left CCA, left carotid bifurcation and cervical left ICA are normal. Proximal subclavian arteries and vertebral artery origins are normal. Both cervical vertebral arteries appear normal to the skull base, the left is mildly dominant. Grossly normal visible anterior and posterior circulation; fetal type right PCA origin suspected, normal variant. See below. MRA HEAD FINDINGS Antegrade flow in the posterior circulation. The distal left  vertebral artery is mildly dominant. No distal vertebral stenosis. Normal PICA origins and vertebrobasilar junction. Patent basilar artery without stenosis. Normal SCA origins. Normal left PCA origin. Fetal type right PCA origin. Left posterior communicating artery is also present. Bilateral PCA branches are within normal limits. Antegrade flow in both ICA siphons. No siphon stenosis. Ophthalmic and posterior communicating artery origins appear normal. Patent carotid termini. Normal MCA and ACA origins. Anterior communicating artery and visible ACA branches are within normal limits. MCA M1 segments, MCA bi/trifurcations and visible bilateral MCA branches are within normal limits. IMPRESSION: 1. No acute intracranial abnormality. 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 When large they have sometimes been implicated in Parinaud's Syndrome. 2. Normal Head and Neck MRA. Electronically Signed   By: HGenevie Ann  M.D.   On: 11/21/2020 06:28   MR ANGIO NECK W WO CONTRAST  Result Date: 11/21/2020 CLINICAL DATA:  50 year old female with headache for 3 weeks. Constant symptoms for multiple days. Electric sensation in body. EXAM: MRI HEAD WITHOUT AND WITH CONTRAST MRA HEAD WITHOUT CONTRAST MRA NECK WITHOUT AND WITH CONTRAST TECHNIQUE: Multiplanar, multiecho pulse sequences of the brain and surrounding structures were obtained without and with intravenous contrast. Angiographic images of the Circle of Willis were obtained using MRA technique without intravenous contrast. Angiographic images of the neck were obtained using MRA technique without and with intravenous contrast. Carotid stenosis measurements (when applicable) are obtained utilizing NASCET criteria, using the distal internal carotid diameter as the denominator. CONTRAST:  6.33m GADAVIST GADOBUTROL 1 MMOL/ML IV SOLN COMPARISON:  Head CT 11/20/2020.  FINDINGS: MRI HEAD FINDINGS Brain: Normal cerebral volume. No restricted diffusion to suggest acute infarction. No midline shift, mass effect, evidence of mass lesion, ventriculomegaly, extra-axial collection or acute intracranial hemorrhage. Cervicomedullary junction and pituitary are within normal limits. Cystic change of the pineal gland measuring about 12 mm (series 9, image 14 and series 7, image 12. No regional mass effect. No abnormal enhancement following contrast. Normal for age gray and white matter signal throughout the brain. No encephalomalacia or chronic cerebral blood products identified. No abnormal enhancement identified. No dural thickening. Vascular: Major intracranial vascular flow voids are preserved. The left vertebral artery appears mildly dominant. The major dural venous sinuses are enhancing and appear to be patent. Skull and upper cervical spine: Negative visible cervical spine. Normal bone marrow signal. Sinuses/Orbits: Negative orbits. Paranasal sinuses and mastoids are stable and well aerated. Other: Grossly normal visible internal auditory structures. Symmetric and negative visible scalp and face soft tissues. MRA NECK FINDINGS Precontrast time-of-flight imaging demonstrates antegrade flow in the bilateral cervical carotid arteries and codominant cervical vertebral arteries. Normal time-of-flight appearance of both carotid bifurcations. Following contrast a 3 vessel arch configuration is demonstrated. Proximal great vessels appear normal. Right CCA, right carotid bifurcation and cervical right ICA are normal. Left CCA, left carotid bifurcation and cervical left ICA are normal. Proximal subclavian arteries and vertebral artery origins are normal. Both cervical vertebral arteries appear normal to the skull base, the left is mildly dominant. Grossly normal visible anterior and posterior circulation; fetal type right PCA origin suspected, normal variant. See below. MRA HEAD FINDINGS  Antegrade flow in the posterior circulation. The distal left vertebral artery is mildly dominant. No distal vertebral stenosis. Normal PICA origins and vertebrobasilar junction. Patent basilar artery without stenosis. Normal SCA origins. Normal left PCA origin. Fetal type right PCA origin. Left posterior communicating artery is also present. Bilateral PCA branches are within normal limits. Antegrade flow in both ICA siphons. No siphon stenosis. Ophthalmic and posterior communicating artery origins appear normal. Patent carotid termini. Normal MCA and ACA origins. Anterior communicating artery and visible ACA branches are within normal limits. MCA M1 segments, MCA bi/trifurcations and visible bilateral MCA branches are within normal limits. IMPRESSION: 1. No acute intracranial abnormality. 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 When large they have sometimes been implicated in Parinaud's Syndrome. 2. Normal Head and Neck MRA. Electronically Signed   By: HGenevie AnnM.D.   On: 11/21/2020 06:28   MR BRAIN W WO CONTRAST  Result Date: 11/21/2020 CLINICAL DATA:  50year old female with headache for 3 weeks. Constant symptoms for multiple days.  Electric sensation in body. EXAM: MRI HEAD WITHOUT AND WITH CONTRAST MRA HEAD WITHOUT CONTRAST MRA NECK WITHOUT AND WITH CONTRAST TECHNIQUE: Multiplanar, multiecho pulse sequences of the brain and surrounding structures were obtained without and with intravenous contrast. Angiographic images of the Circle of Willis were obtained using MRA technique without intravenous contrast. Angiographic images of the neck were obtained using MRA technique without and with intravenous contrast. Carotid stenosis measurements (when applicable) are obtained utilizing NASCET criteria, using the distal internal carotid diameter as the denominator. CONTRAST:  6.10m GADAVIST GADOBUTROL 1  MMOL/ML IV SOLN COMPARISON:  Head CT 11/20/2020. FINDINGS: MRI HEAD FINDINGS Brain: Normal cerebral volume. No restricted diffusion to suggest acute infarction. No midline shift, mass effect, evidence of mass lesion, ventriculomegaly, extra-axial collection or acute intracranial hemorrhage. Cervicomedullary junction and pituitary are within normal limits. Cystic change of the pineal gland measuring about 12 mm (series 9, image 14 and series 7, image 12. No regional mass effect. No abnormal enhancement following contrast. Normal for age gray and white matter signal throughout the brain. No encephalomalacia or chronic cerebral blood products identified. No abnormal enhancement identified. No dural thickening. Vascular: Major intracranial vascular flow voids are preserved. The left vertebral artery appears mildly dominant. The major dural venous sinuses are enhancing and appear to be patent. Skull and upper cervical spine: Negative visible cervical spine. Normal bone marrow signal. Sinuses/Orbits: Negative orbits. Paranasal sinuses and mastoids are stable and well aerated. Other: Grossly normal visible internal auditory structures. Symmetric and negative visible scalp and face soft tissues. MRA NECK FINDINGS Precontrast time-of-flight imaging demonstrates antegrade flow in the bilateral cervical carotid arteries and codominant cervical vertebral arteries. Normal time-of-flight appearance of both carotid bifurcations. Following contrast a 3 vessel arch configuration is demonstrated. Proximal great vessels appear normal. Right CCA, right carotid bifurcation and cervical right ICA are normal. Left CCA, left carotid bifurcation and cervical left ICA are normal. Proximal subclavian arteries and vertebral artery origins are normal. Both cervical vertebral arteries appear normal to the skull base, the left is mildly dominant. Grossly normal visible anterior and posterior circulation; fetal type right PCA origin suspected,  normal variant. See below. MRA HEAD FINDINGS Antegrade flow in the posterior circulation. The distal left vertebral artery is mildly dominant. No distal vertebral stenosis. Normal PICA origins and vertebrobasilar junction. Patent basilar artery without stenosis. Normal SCA origins. Normal left PCA origin. Fetal type right PCA origin. Left posterior communicating artery is also present. Bilateral PCA branches are within normal limits. Antegrade flow in both ICA siphons. No siphon stenosis. Ophthalmic and posterior communicating artery origins appear normal. Patent carotid termini. Normal MCA and ACA origins. Anterior communicating artery and visible ACA branches are within normal limits. MCA M1 segments, MCA bi/trifurcations and visible bilateral MCA branches are within normal limits. IMPRESSION: 1. No acute intracranial abnormality. 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 When large they have sometimes been implicated in Parinaud's Syndrome. 2. Normal Head and Neck MRA. Electronically Signed   By: HGenevie AnnM.D.   On: 11/21/2020 06:28    Procedures Procedures   Medications Ordered in ED Medications  oxyCODONE-acetaminophen (PERCOCET/ROXICET) 5-325 MG per tablet 1 tablet (1 tablet Oral Given 11/21/20 0153)  ondansetron (ZOFRAN-ODT) disintegrating tablet 4 mg (4 mg Oral Given 11/21/20 0153)  sodium chloride 0.9 % bolus 1,000 mL (1,000 mLs Intravenous New Bag/Given 11/21/20 0406)  prochlorperazine (COMPAZINE) injection  10 mg (10 mg Intravenous Given 11/21/20 0406)  diphenhydrAMINE (BENADRYL) injection 25 mg (25 mg Intravenous Given 11/21/20 0406)  gadobutrol (GADAVIST) 1 MMOL/ML injection 6.5 mL (6.5 mLs Intravenous Contrast Given 11/21/20 0602)    ED Course  I have reviewed the triage vital signs and the nursing notes.  Pertinent labs & imaging results that were available during my care of the  patient were reviewed by me and considered in my medical decision making (see chart for details).    MDM Rules/Calculators/A&P                           Patient presents with headache.  Ongoing and recurrent since early July.  Reports history of migraines but states that these are different.  She is overall nontoxic and vital signs are reassuring.  She is neurologically intact.  Prior to being brought back from the waiting room, she had an episode of near syncope.  She had received Percocet for pain and felt very lightheaded.  EKG without acute arrhythmic changes.  Considerations regarding her headache include but not limited to atypical migraine, mass, bleed.  Labs reviewed.  No significant metabolic derangements.  No leukocytosis.  CT scan from yesterday without evidence of acute abnormality.  [facility] provider spoke with neurology who recommended MRA of the head neck.  This was ordered.  Patient was given a migraine cocktail.  MRA is normal.  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  On recheck, she states she feels much better.  We discussed reassuring findings.  Do recommend that she continues to follow-up closely with neurology.  Of note, she mentioned that she did recently go off of birth control.  Often times headaches can be related to hormonal changes in women.    After history, exam, and medical workup I feel the patient has been appropriately medically screened and is safe for discharge home. Pertinent diagnoses were discussed with the patient. Patient was given return precautions.  Final Clinical Impression(s) / ED Diagnoses Final diagnoses:  Acute nonintractable headache, unspecified headache type    Rx / DC Orders ED Discharge Orders     None        Merryl Hacker, MD 11/21/20 [phone]

## 2020-11-21 NOTE — ED Notes (Signed)
Patient transported to MRI 

## 2020-11-21 NOTE — Discharge Instructions (Addendum)
You were seen today for ongoing headache.  Your imaging studies including CT, MRI were very reassuring.  Continue follow-up with your primary physician and neurology.

## 2020-11-21 NOTE — ED Triage Notes (Signed)
Pt c/o HA x3wks, coming & going at first & then constant, current episode since Saturday. Pt hx migraines, "icepick" in nature, states this is "electrical sensation, like a swooshing," says it goes through her body. Went to GP today, had CT scan & shot of toradol which helped but pain came back. Pt denies fevers, chills, denies NVD, denies numbness/tingling, endorses shoulder pain, ringing in ears,states pain is at the crown of her head but sometimes goes down face.

## 2020-11-21 NOTE — ED Provider Notes (Signed)
Emergency Medicine Provider Triage Evaluation Note  Robin Gordon , a 50 y.o. female  was evaluated in triage.  Pt complains of headache.  The patient reports that she has been having a constant headache since July 10.  Pain has been constant, but will wax and wane in intensity.  Headache is generalized.  She is feeling electrical pain shooting throughout her head.  She also reports bilateral tinnitus.  She has noticed that the sharp, shooting pain increases when she turns her head to the right.  She has been sleeping poorly since the onset of this headache due to the pain.  Prior to the onset of her headache on July 10, she did have a similar headache that began when she awoke on the morning of July 3 that lasted for couple of days before completely resolving.  She states that she had complete relief of her symptoms for several days until her headache returned on July 10.  She does also that she had a similar quality headache at the end of May that lasted for about a day before resolving spontaneously.  Headache is accompanied by nausea.  She reports persistent dizziness accompanying the headache, which she describes as feeling as if she is spinning in the room and staying still.  She also reports that she has been feeling odd sensations to the rest of her body.  She has been feeling burning to her skin.  The burning sensation will move throughout her body.  It is not more prominent on 1 side versus another.  She is also noted it to the roof of her mouth.  No known aggravating or alleviating sensations.  No fever, chills, chest pain, shortness of breath, leg swelling, numbness, weakness, visual changes.  No recent falls or injuries.  No history of neck or back surgery.  She does not go to a Restaurant manager, fast food.  She has a history of migraines, but states that the current headache is very different from her usual migraines, which she describes as an ice pick headache.  She has not established with neurology.  She  was seen by her PCP earlier today who had a noncontrasted head CT that was unremarkable.  She was given Toradol with some temporary improvement in her symptoms.  Current headache is 7 out of 10.  She has a history of uterine fibroids and had a fibroid removed earlier this year.  She has previously had anemia secondary to blood loss from her fibroids.  She was on OCPs to help with vaginal bleeding, but these medications were stopped several months ago.  She has attempted to take ibuprofen, Tylenol, and tramadol leftover from her fibroid surgery for her headache without improvement.  Review of Systems  Positive: Headache, dizziness, tenderness, paresthesias, nausea Negative: Chest pain, shortness of breath, numbness, weakness, leg swelling, urinary fecal incontinence, rash, fever, chills, visual changes, otalgia  Physical Exam  BP (!) 153/103   Pulse (!) 108   Temp 97.9 F (36.6 C) (Oral)   Resp 16   LMP 10/26/2020   SpO2 100%  Gen:   Awake, no distress   Resp:  Normal effort  MSK:   Moves extremities without difficulty Other:  Visual fields intact bilaterally.  No ataxia or gait.  Good strength against resistance of the bilateral upper and lower extremities.  Sensation is intact and equal throughout the bilateral upper and lower extremities.  Cranial nerves II through XII are grossly intact.  Medical Decision Making  Medically screening exam initiated at  1:37 AM.  Appropriate orders placed.  KERSTIN HASELEY was informed that the remainder of the evaluation will be completed by another provider, this initial triage assessment does not replace that evaluation, and the importance of remaining in the ED until their evaluation is complete.  50 year old female who presents to the emergency department with a headache that has been persistent for the last 17 days accompanied by tinnitus, nausea, diffuse "burning" paresthesias, and dizziness, which she describes as she feels as if she is spinning, but the  room is standing still.  She was seen by her PCP earlier today was given Toradol with brief improvement, but no resolution of her headache.  She had a negative head CT at her PCPs office earlier today.  Will order basic labs.  Discussed the patient with Dr. Rory Percy, neurology who recommends ordering an MRA of her head and neck as well as an MRI brain with and without contrast.  He would like to be contacted after MRI imaging has resulted.  ADDENDUM:  3:20 AM -triage RN reports that patient now diaphoretic and pale.  She was brought back to triage for reevaluation.  She continues to deny chest pain or shortness of breath.  She is mildly diaphoretic on evaluation.  She has pallor.  She had a brief syncopal episode in triage.  Radial pulses are weak and thready.  On Dynamap, heart rate is in the low 40s.  EKG was obtained with a rate of 49 after patient presented with a heart rate of 108.  Initial blood pressure on arrival was 153/103, repeat blood pressure is now ~80/60s and repeat 82/57.  Given new hypotension and bradycardia, discussed with charge nurse who will room the patient.  This could be secondary to Percocet given in triage as patient typically does not take narcotics.  Her CBG was obtained and was ~110.  Doubt hypoglycemia.  She will be moved to a room for further work-up and evaluation.   Joanne Gavel, PA-C 11/21/20 0328    Fatima Blank, MD 11/21/20 [phone]

## 2020-12-13 ENCOUNTER — Encounter: Payer: Self-pay | Admitting: Neurology

## 2020-12-13 ENCOUNTER — Other Ambulatory Visit: Payer: Self-pay

## 2020-12-13 ENCOUNTER — Ambulatory Visit (INDEPENDENT_AMBULATORY_CARE_PROVIDER_SITE_OTHER): Payer: BC Managed Care – PPO | Admitting: Neurology

## 2020-12-13 VITALS — BP 129/89 | HR 75 | Ht 66.0 in | Wt 139.0 lb

## 2020-12-13 DIAGNOSIS — G43709 Chronic migraine without aura, not intractable, without status migrainosus: Secondary | ICD-10-CM | POA: Diagnosis not present

## 2020-12-13 MED ORDER — ONDANSETRON 4 MG PO TBDP: 4.0000 mg | ORAL_TABLET | Freq: Three times a day (TID) | ORAL | 6 refills | Status: AC | PRN

## 2020-12-13 MED ORDER — RIZATRIPTAN BENZOATE 10 MG PO TBDP: 10.0000 mg | ORAL_TABLET | ORAL | 11 refills | Status: DC | PRN

## 2020-12-13 NOTE — Progress Notes (Signed)
Chief Complaint  Patient presents with   Headache    New Patient: Had worst headache of life on 10/28/2020 that lasted 3 weeks, with buzzing sensation in head Room 16, alone in room      ASSESSMENT AND PLAN  Robin Gordon is a 50 y.o. 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  Lab in July 2022, cbc, Hg 13.9, CMP  MRI, MRA of head and neck on July 27th 2022. CT head 1. No acute intracranial abnormality. 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 When large they have sometimes been implicated in Parinaud's Syndrome. 2. Normal Head and Neck MRA.   MEDICAL HISTORY:  Robin Gordon, is a 50 year old female, seen in request by her primary care physician Dr. Laurann Montana, Margaretha Sheffield, for evaluation of headache, initial evaluation was on December 13, 2020  I reviewed and summarized the referring note. PMHX  She had a history of migraine in the past, her typical migraine a lateralized severe pounding headache with light noise sensitivity, sometimes preceded by visual aura, seeing flashing light in her visual field, even partially lost visual  She had long history of contraceptive use, stopped around June 2022, she could not recall that when she was put on contraceptive treatment years ago, it has helped her migraine  She woke up on October 28, 2020, at the most intense headache, holoacranial pressure headaches, throbbing sound, pressure in her ears, also felt right arm burning sensation, there  was no visual change, but skull sensitivity, felt the hair was on the end,  Severe headache last about 4 days, failed multiple home remedy of Excedrin Migraine, ibuprofen, Tylenol, tizanidine, even tramadol, sleep usually is helpful  She was seen by primary care on November 15, 2020, continue has frequent headaches, was given prednisone tapering dose, without significant improvement, was also giving prescription Roselyn Meier, she tried a couple doses, did not help her headache, received Toradol on November 20, 2020, which has helped her headache, now she is headache free  She denies focal symptoms or signs,  Personally reviewed MRI of the brain with without contrast November 21, 2020, 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  PHYSICAL EXAMNIATION:  Gen: NAD, conversant, well nourised, well groomed                     Cardiovascular: Regular rate rhythm, no peripheral edema, warm, nontender. Eyes: Conjunctivae clear without exudates or hemorrhage Neck: Supple, no carotid bruits. Pulmonary: Clear to auscultation bilaterally   NEUROLOGICAL EXAM:  MENTAL STATUS: Speech:    Speech is normal; fluent and spontaneous with normal comprehension.  Cognition:     Orientation to time, place and person     Normal  recent and remote memory     Normal Attention span and concentration     Normal Language, naming, repeating,spontaneous speech     Fund of knowledge   CRANIAL NERVES: CN II: Visual fields are full to confrontation. Pupils are round equal and briskly reactive to light. CN III, IV, VI: extraocular movement are normal. No ptosis. CN V: Facial sensation is intact to light touch CN VII: Face is symmetric with normal eye closure  CN VIII: Hearing is normal to causal conversation. CN IX,  X: Phonation is normal. CN XI: Head turning and shoulder shrug are intact  MOTOR: There is no pronator drift of out-stretched arms. Muscle bulk and tone are normal. Muscle strength is normal.  REFLEXES: Reflexes are 2+ and symmetric at the biceps, triceps, knees, and ankles. Plantar responses are flexor.  SENSORY: Intact to light touch, pinprick and vibratory sensation are intact in fingers and toes.  COORDINATION: There is no trunk or limb dysmetria noted.  GAIT/STANCE: Posture is normal. Gait is steady with normal steps, base, arm swing, and turning. Heel and toe walking are normal. Tandem gait is normal.  Romberg is absent.  REVIEW OF SYSTEMS:  Full 14 system review of systems performed and notable only for as above All other review of systems were negative.   ALLERGIES: No Known Allergies  HOME MEDICATIONS: Current Outpatient Medications  Medication Sig Dispense Refill   acetaminophen (TYLENOL) 500 MG tablet Take 500 mg by mouth every 6 (six) hours as needed.     Biotin 10 MG TABS Take by mouth.     ibuprofen (ADVIL) 200 MG tablet Take 200 mg by mouth every 6 (six) hours as needed.     Multiple Vitamin (MULTIVITAMIN) tablet Take 1 tablet by mouth daily.     No current facility-administered medications for this visit.    PAST MEDICAL HISTORY: Past Medical History:  Diagnosis Date   Anemia    History of hiatal hernia    Menorrhagia    Migraine    SVD (spontaneous vaginal delivery)    x 2   Wears glasses     PAST SURGICAL HISTORY: Past Surgical History:  Procedure Laterality Date   COLONOSCOPY  LAST DONE 2017   several   DILATATION & CURETTAGE/HYSTEROSCOPY WITH MYOSURE N/A 06/18/2017   Procedure: DILATATION & CURETTAGE/HYSTEROSCOPY WITH MYOSURE XL;  Surgeon: Servando Salina, MD;  Location: [facility];  Service: Gynecology;  Laterality: N/A;   DILATATION & CURETTAGE/HYSTEROSCOPY WITH MYOSURE N/A 05/04/2020   Procedure: Possible MYOSURE Resection;  Surgeon:  Brien Few, MD;  Location: [facility];  Service: Gynecology;  Laterality: N/A;   DILITATION & CURRETTAGE/HYSTROSCOPY WITH NOVASURE ABLATION N/A 05/04/2020   Procedure: DILATATION & CURETTAGE/HYSTEROSCOPY and attempted novasure;  Surgeon: Brien Few, MD;  Location: [facility];  Service: Gynecology;  Laterality: N/A;   UPPER GI ENDOSCOPY  2017   hiatal hernia   WISDOM TOOTH EXTRACTION     WRIST SURGERY Left 2012    FAMILY HISTORY: Family History  Problem Relation Age of Onset   Breast cancer Neg Hx     SOCIAL HISTORY: Social History   Socioeconomic History   Marital status: Married    Spouse name: Jeneen Rinks   Number of children: Not on file   Years of education: Not on file   Highest education level: Not on file  Occupational History   Not on file  Tobacco Use   Smoking status: Never   Smokeless tobacco: Never  Vaping Use  Vaping Use: Never used  Substance and Sexual Activity   Alcohol use: Yes    Alcohol/week: 2.0 standard drinks    Types: 2 Glasses of wine per week    Comment: SOCIAL   Drug use: No   Sexual activity: Yes    Birth control/protection: Pill  Other Topics Concern   Not on file  Social History Narrative   Lives with husband and daughter   Right Handed   Drinks 2-3 cups caffeine daily   Social Determinants of Health   Financial Resource Strain: Not on file  Food Insecurity: Not on file  Transportation Needs: Not on file  Physical Activity: Not on file  Stress: Not on file  Social Connections: Not on file  Intimate Partner Violence: Not on file      Marcial Pacas, M.D. Ph.D.  [facility] [address], [place], [place] [address] Ph: [phone] Fax: [phone]  CC:  Kelton Pillar, MD [place] [address] [employer] [address] [place],  [place] [address]  Kelton Pillar, MD

## 2020-12-13 NOTE — Patient Instructions (Signed)
Meds ordered this encounter  Medications   rizatriptan (MAXALT-MLT) 10 MG disintegrating tablet    Sig: Take 1 tablet (10 mg total) by mouth as needed for migraine. May repeat in 2 hours if needed    Dispense:  9 tablet    Refill:  11   ondansetron (ZOFRAN ODT) 4 MG disintegrating tablet    Sig: Take 1 tablet (4 mg total) by mouth every 8 (eight) hours as needed for nausea or vomiting.    Dispense:  10 tablet    Refill:  6     May mixed it together with aleve, benadryl for prolonged severe headaches.

## 2021-05-27 ENCOUNTER — Ambulatory Visit
Admission: RE | Admit: 2021-05-27 | Discharge: 2021-05-27 | Disposition: A | Discharge status: Home / Self Care | Payer: BC Managed Care – PPO | Source: Ambulatory Visit | Attending: Oral and Maxillofacial Surgery | Admitting: Oral and Maxillofacial Surgery

## 2021-05-27 ENCOUNTER — Other Ambulatory Visit: Payer: Self-pay | Admitting: Oral and Maxillofacial Surgery

## 2021-05-27 DIAGNOSIS — T189XXA Foreign body of alimentary tract, part unspecified, initial encounter: Secondary | ICD-10-CM

## 2021-05-31 ENCOUNTER — Other Ambulatory Visit: Payer: Self-pay | Admitting: Oral and Maxillofacial Surgery

## 2021-05-31 ENCOUNTER — Ambulatory Visit
Admission: RE | Admit: 2021-05-31 | Discharge: 2021-05-31 | Disposition: A | Discharge status: Home / Self Care | Payer: BC Managed Care – PPO | Source: Ambulatory Visit | Attending: Oral and Maxillofacial Surgery | Admitting: Oral and Maxillofacial Surgery

## 2021-05-31 DIAGNOSIS — T189XXA Foreign body of alimentary tract, part unspecified, initial encounter: Secondary | ICD-10-CM

## 2021-06-05 ENCOUNTER — Other Ambulatory Visit: Payer: Self-pay | Admitting: Gastroenterology

## 2021-06-05 ENCOUNTER — Ambulatory Visit
Admission: RE | Admit: 2021-06-05 | Discharge: 2021-06-05 | Disposition: A | Discharge status: Home / Self Care | Payer: BC Managed Care – PPO | Source: Ambulatory Visit | Attending: Gastroenterology | Admitting: Gastroenterology

## 2021-06-05 DIAGNOSIS — T184XXA Foreign body in colon, initial encounter: Secondary | ICD-10-CM

## 2021-10-15 ENCOUNTER — Other Ambulatory Visit: Payer: Self-pay | Admitting: Obstetrics and Gynecology

## 2021-10-15 DIAGNOSIS — Z1231 Encounter for screening mammogram for malignant neoplasm of breast: Secondary | ICD-10-CM

## 2021-10-16 ENCOUNTER — Ambulatory Visit
Admission: RE | Admit: 2021-10-16 | Discharge: 2021-10-16 | Disposition: A | Discharge status: Home / Self Care | Payer: BC Managed Care – PPO | Source: Ambulatory Visit | Attending: Physician Assistant | Admitting: Physician Assistant

## 2021-10-16 ENCOUNTER — Other Ambulatory Visit: Payer: Self-pay | Admitting: Physician Assistant

## 2021-10-16 DIAGNOSIS — R0602 Shortness of breath: Secondary | ICD-10-CM

## 2021-10-16 DIAGNOSIS — R531 Weakness: Secondary | ICD-10-CM

## 2021-10-16 DIAGNOSIS — R202 Paresthesia of skin: Secondary | ICD-10-CM

## 2021-11-18 ENCOUNTER — Ambulatory Visit
Admission: RE | Admit: 2021-11-18 | Discharge: 2021-11-18 | Disposition: A | Discharge status: Home / Self Care | Payer: BC Managed Care – PPO | Source: Ambulatory Visit | Attending: Obstetrics and Gynecology | Admitting: Obstetrics and Gynecology

## 2021-11-18 DIAGNOSIS — Z1231 Encounter for screening mammogram for malignant neoplasm of breast: Secondary | ICD-10-CM

## 2021-12-24 ENCOUNTER — Other Ambulatory Visit: Payer: Self-pay | Admitting: Neurology

## 2022-03-07 ENCOUNTER — Other Ambulatory Visit: Payer: Self-pay | Admitting: Physician Assistant

## 2022-03-07 DIAGNOSIS — Z8262 Family history of osteoporosis: Secondary | ICD-10-CM

## 2022-03-07 DIAGNOSIS — Z780 Asymptomatic menopausal state: Secondary | ICD-10-CM

## 2022-04-03 ENCOUNTER — Ambulatory Visit (INDEPENDENT_AMBULATORY_CARE_PROVIDER_SITE_OTHER): Payer: BC Managed Care – PPO

## 2022-04-03 ENCOUNTER — Encounter: Payer: Self-pay | Admitting: Orthopedic Surgery

## 2022-04-03 ENCOUNTER — Ambulatory Visit: Payer: BC Managed Care – PPO | Admitting: Orthopedic Surgery

## 2022-04-03 VITALS — BP 126/74 | HR 94

## 2022-04-03 DIAGNOSIS — M25552 Pain in left hip: Secondary | ICD-10-CM | POA: Diagnosis not present

## 2022-04-03 NOTE — Progress Notes (Signed)
Orthopedic Spine Surgery Office Note  Assessment: Patient is a 51 y.o. female with left hip pain and a burning sensation over the lateral thigh that goes into the anterior thigh. Mechanism was a twisting injury in the shower and felt a pop. Has anterior and posterior hip pain. Suspect a labral tear. Possible L3 radiculopathy given the distribution   Plan: -Her primary care doctor has provider her with medication - oral steroid and muscle relaxer - that has given her significant relief -Explained that we can continue with conservative treatments and can use tylenol and NSAIDs once she runs out of the above prescriptions -I recommended strengthening the muscles around the hip - the hip flexors, extensors, gluteus  -Recommend core strengthening for her back -If pain returns in the hip, would consider getting MRI of the left hip to evaluate for labral tear -Patient should return to office on an as needed basis   Patient expressed understanding of the plan and all questions were answered to the patient's satisfaction.   ___________________________________________________________________________   History:  Patient is a 51 y.o. female who presents today for left hip pain and low back pain. The patient gets frequently low back pain. It is noted mostly after working in a flexed position for awhile. She describes it if she is working on bouquets for [employer]. This has been present for several years. It gets better after changing position for awhile or sleeping. There is no pain radiating into the legs associated with this back pain. There was no injury or trauma that brought on the pain.   About 1 month ago, she noted acute onset of left hip pain. Was in the shower when she twisted her hip. Felt her hip pop and felt like it came out of place. She was unable to walk and had significant pain. Her primary care provider gave her an oral steroid and muscle relaxer that did provide her with substantial relief.  The pain has been getting better with time. It is now much better. She is afraid though to be as active as she was for fear of reinjury. Has been able to ambulate without assistive devices. Has a burning sensation that still remains over the lateral aspect of the hip that radiates into the anterior thigh. No right sided symptoms. Has not had pain like this before. Denies paresthesias and numbness.   Weakness: denies Symptoms of imbalance: denies Paresthesias and numbness: denies Bowel or bladder incontinence: has stress incontinence, no recent changes in bowel or bladder habits Saddle anesthesia: denies  Treatments tried: activity modification, muscle relaxer, oral steroid  Review of systems: Denies fevers and chills, night sweats, unexplained weight loss, history of cancer. Has had pain that wakes her at night  Past medical history: Migraines Chronic low back pain (not severe) Anemia Iron deficiency  Allergies: NKDA  Past surgical history:  Fibroid removal Wrist surgery Wisdom tooth extraction  Social history: Denies use of nicotine product (smoking, vaping, patches, smokeless) Alcohol use: yes, 2-3 drinks/week Denies recreational drug use   Physical Exam:  General: no acute distress, appears stated age Neurologic: alert, answering questions appropriately, following commands Respiratory: unlabored breathing on room air, symmetric chest rise Psychiatric: appropriate affect, normal cadence to speech   MSK (spine):  -Strength exam      Left  Right EHL    5/5  5/5 TA    5/5  5/5 GSC    5/5  5/5 Knee extension  5/5  5/5 Hip flexion   5/5  5/5  -  Sensory exam    Sensation intact to light touch in L3-S1 nerve distributions of bilateral lower extremities  -Achilles DTR: 2/4 on the left, 2/4 on the right -Patellar tendon DTR: 2/4 on the left, 2/4 on the right  -Straight leg raise: negative -Contralateral straight leg raise: negative -Femoral nerve stretch test:  negative -Clonus: no beats bilaterally  -Left hip exam: positive FADIR, negative FABER, no tenderness to palpation over the hip, full range of motion -Right hip exam: no paint through range of motion, negative FABER, negative FADIR  Imaging: XR of the scoli spine from 04/03/2022 was independently reviewed and interpreted, showing well balanced coronal and sagittal alignment. No fracture seen. Spondylolisthesis at C3/4 and C4/5.   XR of the left hip from 04/03/2022 was independently reviewed and interpreted, showing no fracture or dislocation. Early degenerative changes with joint space narrowing, subchondral sclerosis, and osteophyte formation.    Patient name: Robin Gordon Patient MRN: [id number] Date of visit: 04/03/22

## 2022-08-06 ENCOUNTER — Ambulatory Visit
Admission: EM | Admit: 2022-08-06 | Discharge: 2022-08-06 | Disposition: A | Discharge status: Home / Self Care | Payer: BC Managed Care – PPO

## 2022-08-06 ENCOUNTER — Emergency Department
Admission: EM | Admit: 2022-08-06 | Discharge: 2022-08-06 | Disposition: A | Discharge status: Home / Self Care | Payer: BC Managed Care – PPO | Attending: Emergency Medicine | Admitting: Emergency Medicine

## 2022-08-06 ENCOUNTER — Other Ambulatory Visit: Payer: Self-pay

## 2022-08-06 DIAGNOSIS — L500 Allergic urticaria: Secondary | ICD-10-CM | POA: Insufficient documentation

## 2022-08-06 DIAGNOSIS — L299 Pruritus, unspecified: Secondary | ICD-10-CM | POA: Diagnosis present

## 2022-08-06 DIAGNOSIS — T7840XA Allergy, unspecified, initial encounter: Secondary | ICD-10-CM

## 2022-08-06 MED ORDER — FAMOTIDINE IN NACL 20-0.9 MG/50ML-% IV SOLN
20.0000 mg | Freq: Once | INTRAVENOUS | Status: AC
  Administered 2022-08-06: 20 mg via INTRAVENOUS
  Filled 2022-08-06: qty 50

## 2022-08-06 MED ORDER — DIPHENHYDRAMINE HCL 50 MG/ML IJ SOLN
50.0000 mg | Freq: Once | INTRAMUSCULAR | Status: AC
  Administered 2022-08-06: 50 mg via INTRAVENOUS
  Filled 2022-08-06: qty 1

## 2022-08-06 MED ORDER — SODIUM CHLORIDE 0.9 % IV BOLUS
1000.0000 mL | Freq: Once | INTRAVENOUS | Status: AC
  Administered 2022-08-06: 1000 mL via INTRAVENOUS

## 2022-08-06 MED ORDER — PREDNISONE 20 MG PO TABS: 40.0000 mg | ORAL_TABLET | Freq: Every day | ORAL | 0 refills | Status: AC

## 2022-08-06 MED ORDER — METHYLPREDNISOLONE SODIUM SUCC 125 MG IJ SOLR
125.0000 mg | Freq: Once | INTRAMUSCULAR | Status: AC
  Administered 2022-08-06: 125 mg via INTRAVENOUS
  Filled 2022-08-06: qty 2

## 2022-08-06 MED ORDER — EPINEPHRINE 0.3 MG/0.3ML IJ SOAJ: 0.3000 mg | INTRAMUSCULAR | 2 refills | Status: AC | PRN

## 2022-08-06 NOTE — ED Provider Notes (Signed)
[facility] Provider Note    Event Date/Time   First MD Initiated Contact with Patient 08/06/22 2031     (approximate)  History   Chief Complaint: Allergic Reaction  HPI  Robin Gordon is a 52 y.o. female with a past medical history of anemia, presents the emergency department for an allergic reaction.  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  She states last week she was stung on the top of the head causing a significant headache but again no further reaction.  Today she was stung at the top of the chest/lower neck, she states initially no reaction however approximately 20 minutes later so she began experiencing diffuse itching and rash throughout her body.  Patient states she took 50 mg of Benadryl however she began experiencing a "furry" sensation to her tongue and it felt like it was enlarging so she took an EpiPen which they have at home due to the beekeeping.  States she has never required an EpiPen previously.  Patient states on arrival she is feeling much better feels like the swelling has come down in the tongue and denies any trouble breathing or swallowing.  Physical Exam   Triage Vital Signs: ED Triage Vitals  Enc Vitals Group     BP 08/06/22 2015 136/82     Pulse Rate 08/06/22 2015 (!) 118     Resp 08/06/22 2015 18     Temp 08/06/22 2015 98.3 F (36.8 C)     Temp Source 08/06/22 2015 Oral     SpO2 08/06/22 2015 100 %     Weight 08/06/22 2017 138 lb (62.6 kg)     Height 08/06/22 2017 5\' 6"  (1.676 m)     Head Circumference --      Peak Flow --      Pain Score 08/06/22 2017 0     Pain Loc --      Pain Edu? --      Excl. in GC? --     Most recent vital signs: Vitals:   08/06/22 2015 08/06/22 2040  BP: 136/82 118/80  Pulse: (!) 118 100  Resp: 18 18  Temp: 98.3 F (36.8 C)   SpO2: 100% 100%     General: Awake, no distress.  CV:  Good peripheral perfusion.  Regular rate and rhythm Resp:  Normal effort.  Equal breath sounds bilaterally.  No wheeze. Abd:  No distention.  Soft, nontender.  No rebound or guarding. Other:  No oral edema identified.  Patient does have urticaria/erythematous patches over her upper and lower extremities especially larger patches to the chest and upper back.  ED Results / Procedures / Treatments    MEDICATIONS ORDERED IN ED: Medications  diphenhydrAMINE (BENADRYL) injection 50 mg (has no administration in time range)  famotidine (PEPCID) IVPB 20 mg premix (has no administration in time range)  sodium chloride 0.9 % bolus 1,000 mL (has no administration in time range)     IMPRESSION / MDM / ASSESSMENT AND PLAN / ED COURSE  I reviewed the triage vital signs and the nursing notes.  Patient's presentation is most consistent with acute presentation with potential threat to life or bodily function.  Patient arrives to the emergency department with diffuse rash after being stung by a bee.  Symptoms very suggestive of significant allergic reaction possible anaphylaxis  given the sensation of swelling of the tongue.  No obvious edema currently.  Widely patent oropharynx.  Does have a diffuse rash still.  We will dose IV steroids Benadryl and Pepcid.  We will IV hydrate and continue to closely monitor.  Patient gave herself an EpiPen around 7 PM.  Will monitor until around 10:30 PM to ensure no worsening.  ----------------------------------------- 10:57 PM on 08/06/2022 ----------------------------------------- Patient states she is feeling much better.  Hives are significantly reduced has several small spots on the back but much improved from earlier.  Patient states she is feeling well and is ready to go home.  Will discharge with prednisone for the next 5 days we will refill the patient's EpiPen's.  Patient agreeable to plan.  Provided my typical allergic  reaction return precautions.  FINAL CLINICAL IMPRESSION(S) / ED DIAGNOSES   Allergic reaction   Rx / DC Orders   EpiPen Prednisone Benadryl as needed  Note:  This document was prepared using Dragon voice recognition software and may include unintentional dictation errors.   Minna Antis, MD 08/06/22 2258

## 2022-08-06 NOTE — ED Triage Notes (Signed)
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 Pt reports stung on throat today and that her throat and tongue began feeling fuzzy. Pt reports taking 50 mg benadryl and an epi pen ~1900 today. Pt arrives to ED alert and oriented and ambulatory. Hives and swelling noted to arms bilaterally. Pt breathing unlabored at this time and maintaining secretions. Pt reports improvement in throat/tongue fuzziness.

## 2022-08-06 NOTE — ED Triage Notes (Signed)
Patient presents to [facility] for evaluation after using her Epi pen at home after being stung on the neck approx 1900.  Patient used her pen, has taken 2 benadryls, and is still c/o some tightening in her throat, and diffuse hive rash.  It was patient's own Epi Pen.  Patient is denying any trouble breathing, denies chest pain, denies tongue swelling.  Patient is wanting to take herself to th eED for further evaluation and monitoring.

## 2022-09-05 ENCOUNTER — Other Ambulatory Visit: Payer: BC Managed Care – PPO

## 2022-10-27 ENCOUNTER — Other Ambulatory Visit: Payer: Self-pay | Admitting: Obstetrics and Gynecology

## 2022-10-27 DIAGNOSIS — Z1231 Encounter for screening mammogram for malignant neoplasm of breast: Secondary | ICD-10-CM

## 2022-10-30 IMAGING — CR DG ABDOMEN 1V
1 series · 1 of 1 positions shown · non-contrast
Comparison: 05/27/2021

CLINICAL DATA: Foreign body in digestive tract. Patient swallowed
part of the dental implant.

EXAM:
ABDOMEN - 1 VIEW

[t abdomen supine]
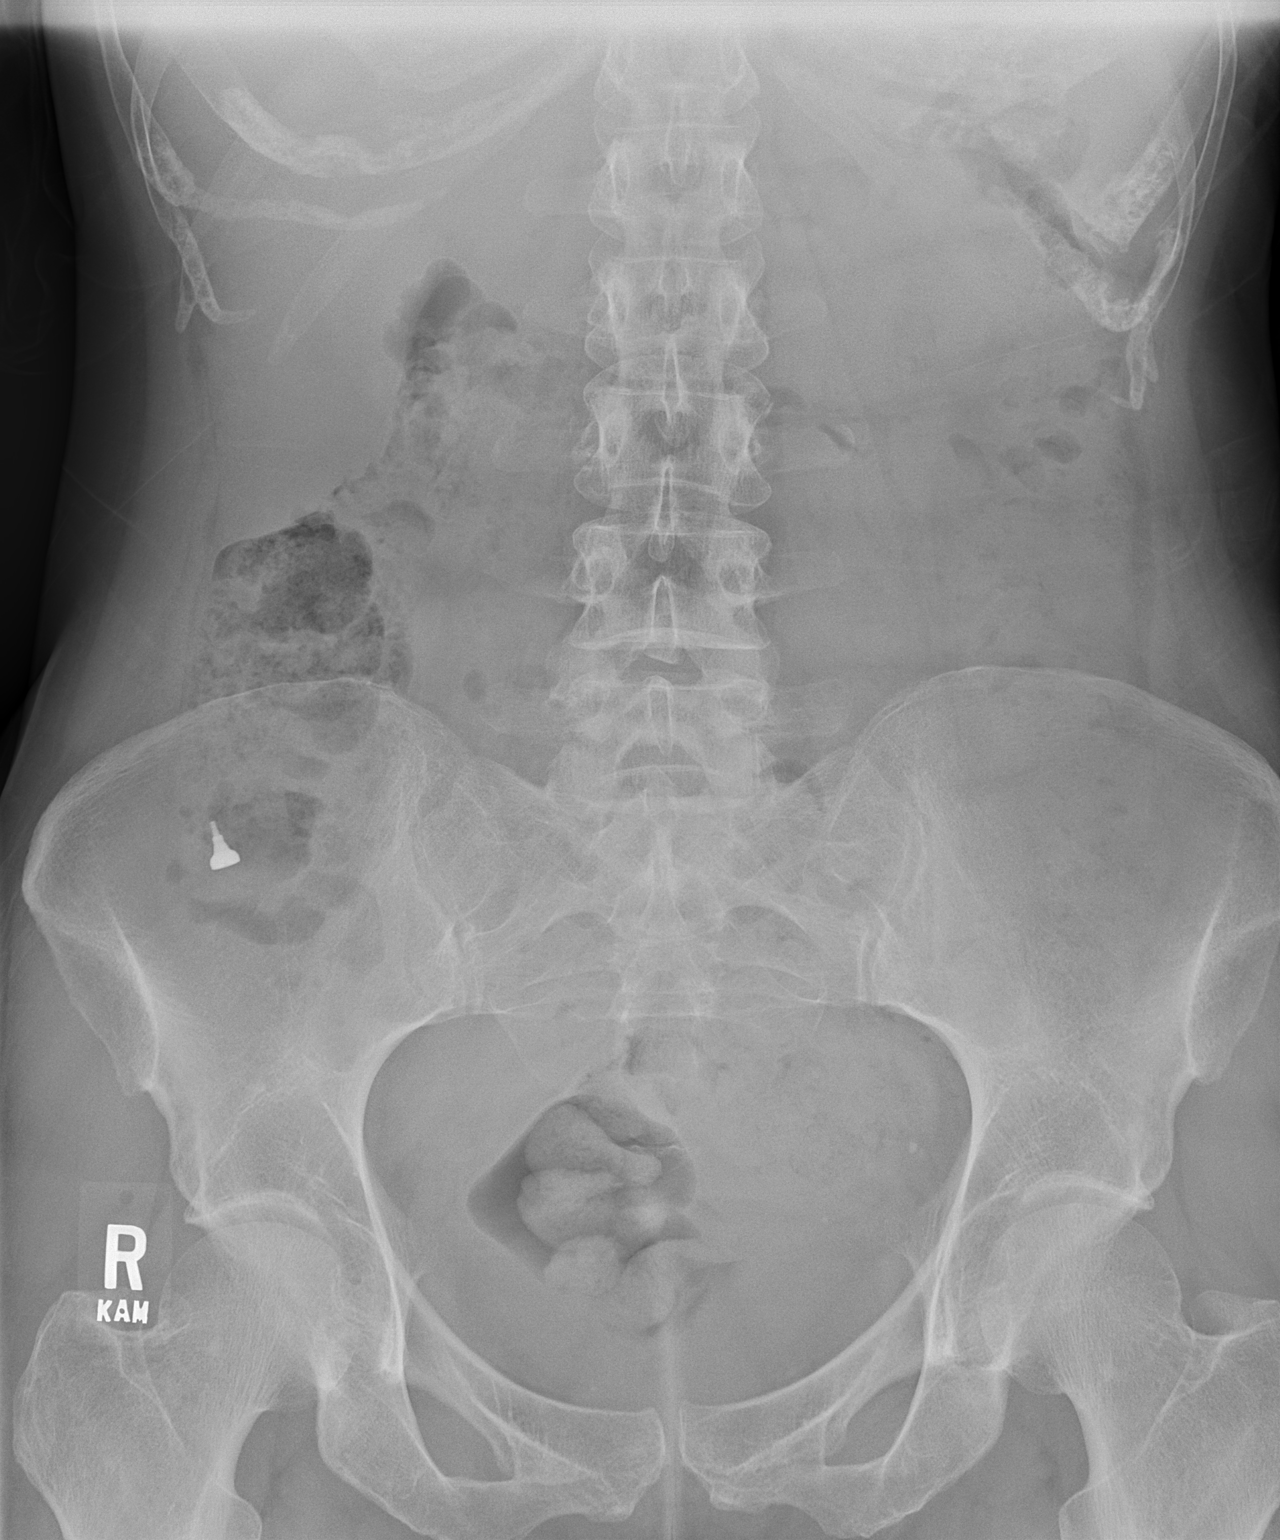

[1 of 1 positions shown; findings below may reference images not displayed]

FINDINGS: Metallic foreign body consistent with history of swallowed dental
implant is again demonstrated in the right lower quadrant, likely in
the cecum. Positioning is similar to the previous study.

Normal bowel gas pattern with scattered gas and stool in the colon.
No small or large bowel distention. No radiopaque stones. Mild
degenerative changes in the spine and hips. Visualized soft tissue
contours appear intact.
IMPRESSION: Radiopaque foreign body again demonstrated in the right lower
quadrant, likely in the cecum. Location is similar to the previous
study.

## 2022-11-24 ENCOUNTER — Ambulatory Visit
Admission: RE | Admit: 2022-11-24 | Discharge: 2022-11-24 | Disposition: A | Discharge status: Home / Self Care | Payer: BC Managed Care – PPO | Source: Ambulatory Visit | Attending: Obstetrics and Gynecology | Admitting: Obstetrics and Gynecology

## 2022-11-24 DIAGNOSIS — Z1231 Encounter for screening mammogram for malignant neoplasm of breast: Secondary | ICD-10-CM

## 2023-01-28 ENCOUNTER — Other Ambulatory Visit: Payer: Self-pay | Admitting: Physician Assistant

## 2023-01-28 DIAGNOSIS — Z8262 Family history of osteoporosis: Secondary | ICD-10-CM

## 2023-11-12 ENCOUNTER — Other Ambulatory Visit: Payer: Self-pay | Admitting: Obstetrics and Gynecology

## 2023-11-12 DIAGNOSIS — Z0000 Encounter for general adult medical examination without abnormal findings: Secondary | ICD-10-CM

## 2023-11-19 ENCOUNTER — Other Ambulatory Visit: Payer: Self-pay | Admitting: Gastroenterology

## 2023-11-19 DIAGNOSIS — R109 Unspecified abdominal pain: Secondary | ICD-10-CM

## 2023-11-20 ENCOUNTER — Encounter: Payer: Self-pay | Admitting: Gastroenterology

## 2023-11-23 ENCOUNTER — Ambulatory Visit
Admission: RE | Admit: 2023-11-23 | Discharge: 2023-11-23 | Disposition: A | Discharge status: Home / Self Care | Payer: Self-pay | Source: Ambulatory Visit | Attending: Gastroenterology | Admitting: Gastroenterology

## 2023-11-23 DIAGNOSIS — R109 Unspecified abdominal pain: Secondary | ICD-10-CM

## 2023-12-01 ENCOUNTER — Other Ambulatory Visit: Payer: Self-pay | Admitting: Internal Medicine

## 2023-12-01 ENCOUNTER — Ambulatory Visit
Admission: RE | Admit: 2023-12-01 | Discharge: 2023-12-01 | Disposition: A | Discharge status: Home / Self Care | Payer: Self-pay | Source: Ambulatory Visit | Attending: Obstetrics and Gynecology | Admitting: Obstetrics and Gynecology

## 2023-12-01 DIAGNOSIS — Z0000 Encounter for general adult medical examination without abnormal findings: Secondary | ICD-10-CM

## 2024-01-15 ENCOUNTER — Other Ambulatory Visit (HOSPITAL_BASED_OUTPATIENT_CLINIC_OR_DEPARTMENT_OTHER): Payer: Self-pay | Admitting: Internal Medicine

## 2024-01-15 DIAGNOSIS — Z8262 Family history of osteoporosis: Secondary | ICD-10-CM
